# Patient Record
Sex: Female | Born: 1990
Health system: Southern US, Community
[De-identification: ages and names within clinical notes are randomized; demographics above are authoritative.]

## PROBLEM LIST (undated history)

## (undated) DIAGNOSIS — F419 Anxiety disorder, unspecified: Secondary | ICD-10-CM

## (undated) DIAGNOSIS — M549 Dorsalgia, unspecified: Secondary | ICD-10-CM

## (undated) DIAGNOSIS — F32A Depression, unspecified: Secondary | ICD-10-CM

## (undated) DIAGNOSIS — T7840XA Allergy, unspecified, initial encounter: Secondary | ICD-10-CM

## (undated) DIAGNOSIS — K219 Gastro-esophageal reflux disease without esophagitis: Secondary | ICD-10-CM

## (undated) HISTORY — DX: Allergy, unspecified, initial encounter: T78.40XA

## (undated) HISTORY — PX: TOE SURGERY: SHX1073

## (undated) HISTORY — PX: WISDOM TOOTH EXTRACTION: SHX21

## (undated) HISTORY — DX: Depression, unspecified: F32.A

## (undated) HISTORY — DX: Gastro-esophageal reflux disease without esophagitis: K21.9

## (undated) SURGERY — Surgical Case
Anesthesia: *Unknown

---
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
## 2013-06-19 LAB — HM PAP SMEAR: HM PAP: NEGATIVE

## 2014-04-15 DIAGNOSIS — Q270 Congenital absence and hypoplasia of umbilical artery: Secondary | ICD-10-CM | POA: Insufficient documentation

## 2014-04-15 HISTORY — DX: Congenital absence and hypoplasia of umbilical artery: Q27.0

## 2014-05-26 LAB — HM PAP SMEAR: HM PAP: NEGATIVE

## 2014-06-18 DIAGNOSIS — Z3490 Encounter for supervision of normal pregnancy, unspecified, unspecified trimester: Secondary | ICD-10-CM | POA: Insufficient documentation

## 2014-06-18 HISTORY — DX: Encounter for supervision of normal pregnancy, unspecified, unspecified trimester: Z34.90

## 2014-07-30 DIAGNOSIS — O368390 Maternal care for abnormalities of the fetal heart rate or rhythm, unspecified trimester, not applicable or unspecified: Secondary | ICD-10-CM

## 2014-07-30 HISTORY — DX: Maternal care for abnormalities of the fetal heart rate or rhythm, unspecified trimester, not applicable or unspecified: O36.8390

## 2014-09-14 DIAGNOSIS — Z98891 History of uterine scar from previous surgery: Secondary | ICD-10-CM | POA: Insufficient documentation

## 2014-09-14 DIAGNOSIS — Z30430 Encounter for insertion of intrauterine contraceptive device: Secondary | ICD-10-CM | POA: Insufficient documentation

## 2014-11-09 ENCOUNTER — Encounter: Payer: Self-pay | Admitting: *Deleted

## 2015-01-18 ENCOUNTER — Encounter: Payer: Self-pay | Admitting: Family Medicine

## 2015-01-18 ENCOUNTER — Ambulatory Visit (INDEPENDENT_AMBULATORY_CARE_PROVIDER_SITE_OTHER): Payer: Medicaid Other | Admitting: Family Medicine

## 2015-01-18 VITALS — BP 118/78 | Temp 99.3°F

## 2015-01-18 DIAGNOSIS — J029 Acute pharyngitis, unspecified: Secondary | ICD-10-CM

## 2015-01-18 MED ORDER — AZITHROMYCIN 250 MG PO TABS: ORAL_TABLET | ORAL | Status: DC

## 2015-01-18 NOTE — Progress Notes (Signed)
   Subjective:    Patient ID: Rebecca Schroeder, female    DOB: 06-Oct-1990, 24 y.o.   MRN: [id number]  HPI Chief Complaint  Patient presents with  . sore throat    sore throat this morning., low grade fever, hurts to swallow. pain in left ear and when drinking or eating feels like its going to pour out her eat.    She is new to the practice and here with complaints of fatigue since yesterday, low grade fever, sore throat, post nasal drainage and mild cough since this morning upon awakening. Reports when she swallows her left ear hurts. She also reports mild dizziness for a couple of hours that she describes as if her body was on a boat. Eating and drinking less than normal due to throat pain.  Does not smoke. 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  She has not taken any medications today.   PMH: none Surgeries: C-section, wisdom teeth, left toe    reviewed allergies, medications, past medical , surgical and social history.  Review of Systems  pertinent positives and negatives in the history of present illness.    Objective:   Physical Exam BP 118/78 mmHg  Temp(Src) 99.3 F (37.4 C) (Tympanic) Alert and in no distress.  No sinus tenderness. Left tympanic membrane with fluid, no bulging or erythema, right tympanic membrane normal and  Bilateral canals are normal. Pharyngeal area is erythematous with bilateral tonsillar edema 2+, no exudate. Neck is supple with left anterior cervical adenopathy and tenderness. Cardiac exam shows a regular sinus rhythm without murmurs or gallops. Lungs are clear to auscultation.   negative rapid strep    Assessment & Plan:  Acute pharyngitis, unspecified etiology  Discussed that her strep test was negative but this could be because she is early in her illness. Recommend symptomatic treatment with saltwater gargles , warm fluids, Tylenol or ibuprofen. She may also take an over-the-counter decongestant.  Recommend staying well  hydrated and she will let me know if she is not improving in the next 2-3 days or if she is not back to normal after completing the antibiotic.

## 2015-01-18 NOTE — Patient Instructions (Signed)
Treat your symptoms,  Use salt water gargles, or warm fluids for your sore throats, take  Ibuprofen or Tylenol for fever and pain,  Make sure you're drinking plenty of fluids.  If you are not improving in the next 2-3 days let me know. If you are not back to normal after completing the antibiotic let me know.  Pharyngitis Pharyngitis is redness, pain, and swelling (inflammation) of your pharynx.  CAUSES  Pharyngitis is usually caused by infection. Most of the time, these infections are from viruses (viral) and are part of a cold. However, sometimes pharyngitis is caused by bacteria (bacterial). Pharyngitis can also be caused by allergies. Viral pharyngitis may be spread from person to person by coughing, sneezing, and personal items or utensils (cups, forks, spoons, toothbrushes). Bacterial pharyngitis may be spread from person to person by more intimate contact, such as kissing.  SIGNS AND SYMPTOMS  Symptoms of pharyngitis include:   Sore throat.   Tiredness (fatigue).   Low-grade fever.   Headache.  Joint pain and muscle aches.  Skin rashes.  Swollen lymph nodes.  Plaque-like film on throat or tonsils (often seen with bacterial pharyngitis). DIAGNOSIS  Your health care provider will ask you questions about your illness and your symptoms. Your medical history, along with a physical exam, is often all that is needed to diagnose pharyngitis. Sometimes, a rapid strep test is done. Other lab tests may also be done, depending on the suspected cause.  TREATMENT  Viral pharyngitis will usually get better in 3-4 days without the use of medicine. Bacterial pharyngitis is treated with medicines that kill germs (antibiotics).  HOME CARE INSTRUCTIONS   Drink enough water and fluids to keep your urine clear or pale yellow.   Only take over-the-counter or prescription medicines as directed by your health care provider:   If you are prescribed antibiotics, make sure you finish them even if  you start to feel better.   Do not take aspirin.   Get lots of rest.   Gargle with 8 oz of salt water ( tsp of salt per 1 qt of water) as often as every 1-2 hours to soothe your throat.   Throat lozenges (if you are not at risk for choking) or sprays may be used to soothe your throat. SEEK MEDICAL CARE IF:   You have large, tender lumps in your neck.  You have a rash.  You cough up green, yellow-brown, or bloody spit. SEEK IMMEDIATE MEDICAL CARE IF:   Your neck becomes stiff.  You drool or are unable to swallow liquids.  You vomit or are unable to keep medicines or liquids down.  You have severe pain that does not go away with the use of recommended medicines.  You have trouble breathing (not caused by a stuffy nose). MAKE SURE YOU:   Understand these instructions.  Will watch your condition.  Will get help right away if you are not doing well or get worse.   This information is not intended to replace advice given to you by your health care provider. Make sure you discuss any questions you have with your health care provider.   Document Released: 01/22/2005 Document Revised: 11/12/2012 Document Reviewed: 09/29/2012 Elsevier Interactive Patient Education [employer]2016 Elsevier Inc.

## 2015-01-21 LAB — POCT RAPID STREP A (OFFICE): Rapid Strep A Screen: NEGATIVE

## 2015-04-12 DIAGNOSIS — N938 Other specified abnormal uterine and vaginal bleeding: Secondary | ICD-10-CM | POA: Diagnosis not present

## 2015-04-13 ENCOUNTER — Other Ambulatory Visit: Payer: 59

## 2015-04-13 DIAGNOSIS — Z0184 Encounter for antibody response examination: Secondary | ICD-10-CM

## 2015-04-14 LAB — ACUTE HEP PANEL AND HEP B SURFACE AB
HCV Ab: NEGATIVE
Hep A IgM: NONREACTIVE
Hep B C IgM: NONREACTIVE
Hepatitis B Surface Ag: NEGATIVE

## 2015-04-21 ENCOUNTER — Ambulatory Visit (INDEPENDENT_AMBULATORY_CARE_PROVIDER_SITE_OTHER): Payer: 59 | Admitting: Physician Assistant

## 2015-04-21 ENCOUNTER — Encounter: Payer: Self-pay | Admitting: Physician Assistant

## 2015-04-21 VITALS — BP 120/70 | HR 72 | Temp 98.5°F | Resp 18 | Ht 60.0 in | Wt 158.0 lb

## 2015-04-21 DIAGNOSIS — Z0000 Encounter for general adult medical examination without abnormal findings: Secondary | ICD-10-CM | POA: Diagnosis not present

## 2015-04-21 NOTE — Progress Notes (Signed)
Patient ID: Rebecca BrightlyCourtney M Demario MRN: [id number], DOB: 02/18/1990, 25 y.o. Date of Encounter: 04/21/2015,   Chief Complaint: New Pt. Establish Care. Has had Gyn Exam. Has 128 month old son. Physical (CPE)  HPI: 25 y.o. y/o white female  here for above.   She states that she had complicated pregnancy so had to switch gynecologist and see a specialist. 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  Says that she is now seeing a new gynecologist. Has Mirena and is also using NuvaRing for 1 month because of bleeding issues and this was prescribed by GYN.  She states that she works at [employer] which she says is on [place].  She states that she is very frustrated with her weight. Says that she went through a time of emotional stress because of issues with the baby's father. Is that during that time she had no appetite and was eating very little. Says that she is also been exercising. Is that even during that time she still lost very little weight.   No other complaints or concerns.  Says that she has had no primary care provider in many years and just is here to establish as well as have physical.   Review of Systems: Consitutional: No fever, chills, fatigue, night sweats, lymphadenopathy. Eyes: No visual changes, eye redness, or discharge. ENT/Mouth: No ear pain, sore throat, nasal drainage, or sinus pain. Cardiovascular: No chest pressure,heaviness, tightness or squeezing, even with exertion. No increased shortness of breath or dyspnea on exertion.No palpitations, edema, orthopnea, PND. Respiratory: No cough, hemoptysis, SOB, or wheezing. Gastrointestinal: No anorexia, dysphagia, reflux, pain, nausea, vomiting, hematemesis, diarrhea, constipation, BRBPR, or melena. Breast: No mass, nodules, bulging, or retraction. No skin changes or inflammation. No nipple discharge. No lymphadenopathy. Genitourinary: No dysuria, hematuria, incontinence, vaginal discharge, pruritis, burning, abnormal bleeding,  or pain. Musculoskeletal: No decreased ROM, No joint pain or swelling. No significant pain in neck, back, or extremities. Skin: No rash, pruritis, or concerning lesions. Neurological: No headache, dizziness, syncope, seizures, tremors, memory loss, coordination problems, or paresthesias. Psychological: No anxiety, depression, hallucinations, SI/HI. Endocrine: No polydipsia, polyphagia, polyuria, or known diabetes.No increased fatigue. No palpitations/rapid heart rate. No significant/unexplained weight change. All other systems were reviewed and are otherwise negative.  No past medical history on file.   Past Surgical History  Procedure Laterality Date  . Cesarean section    . Wisdom tooth extraction      Home Meds:  Outpatient Prescriptions Prior to Visit  Medication Sig Dispense Refill  . levonorgestrel (MIRENA) 20 MCG/24HR IUD 1 each by Intrauterine route once.    Marland Kitchen. azithromycin (ZITHROMAX Z-PAK) 250 MG tablet Take 2 pills on day 1, then take 1 pill on days 2 through 5. 6 each 0   No facility-administered medications prior to visit.    Allergies:  Allergies  Allergen Reactions  . Codeine Nausea And Vomiting  . Hydrocodone-Acetaminophen Nausea And Vomiting  . Penicillins Other (See Comments)    unknown    Social History   Social History  . Marital Status: Single    Spouse Name: N/A  . Number of Children: N/A  . Years of Education: N/A   Occupational History  . Not on file.   Social History Main Topics  . Smoking status: Never Smoker   . Smokeless tobacco: Not on file  . Alcohol Use: No  . Drug Use: No  . Sexual Activity: Yes    Birth Control/ Protection: IUD   Other Topics Concern  . Not  on file   Social History Narrative    History reviewed. No pertinent family history.  Physical Exam: Blood pressure 120/70, pulse 72, temperature 98.5 F (36.9 C), temperature source Oral, resp. rate 18, height 5' (1.524 m), weight 158 lb (71.668 kg)., Body mass index  is 30.86 kg/(m^2). General: Well developed, well nourished,WF. Appears in no acute distress. HEENT: Normocephalic, atraumatic. Conjunctiva pink, sclera non-icteric. Pupils 2 mm constricting to 1 mm, round, regular, and equally reactive to light and accomodation. EOMI. Internal auditory canal clear. TMs with good cone of light and without pathology. Nasal mucosa pink. Nares are without discharge. No sinus tenderness. Oral mucosa pink.  Pharynx without exudate.   Neck: Supple. Trachea midline. No thyromegaly. Full ROM. No lymphadenopathy.No Carotid Bruits. Lungs: Clear to auscultation bilaterally without wheezes, rales, or rhonchi. Breathing is of normal effort and unlabored. Cardiovascular: RRR with S1 S2. No murmurs, rubs, or gallops. Distal pulses 2+ symmetrically. No carotid or abdominal bruits. Breast: Per Gyn Abdomen: Soft, non-tender, non-distended with normoactive bowel sounds. No hepatosplenomegaly or masses. No rebound/guarding. No CVA tenderness. No hernias.  Genitourinary: Per Gyn.  Musculoskeletal: Full range of motion and 5/5 strength throughout.  Skin: Warm and moist without erythema, ecchymosis, wounds, or rash. Neuro: A+Ox3. CN II-XII grossly intact. Moves all extremities spontaneously. Full sensation throughout. Normal gait. Psych:  Responds to questions appropriately with a normal affect.   Assessment/Plan:  25 y.o. y/o female here for CPE  1. Visit for preventive health examination A. Screening Labs: Secondary to her work schedule, she will not be able to come in fasting. Therefore, will just go ahead and check other labs now. - CBC with Differential/Platelet - COMPLETE METABOLIC PANEL WITH GFR - TSH - VITAMIN D 25 Hydroxy (Vit-D Deficiency, Fractures)  B. Pap: Per Gyn  F. Immunizations:  Influenza:  She received Fall 2016 Tetanus:  She states that she received this at [facility] during her pregnancy.  Murray Hodgkins Knapp, [place], [facility] 04/21/2015 4:09 PM

## 2015-04-22 ENCOUNTER — Ambulatory Visit (INDEPENDENT_AMBULATORY_CARE_PROVIDER_SITE_OTHER): Payer: 59 | Admitting: Medical

## 2015-04-22 VITALS — BP 122/70 | HR 68

## 2015-04-22 DIAGNOSIS — R300 Dysuria: Secondary | ICD-10-CM

## 2015-04-22 DIAGNOSIS — R350 Frequency of micturition: Secondary | ICD-10-CM

## 2015-04-22 LAB — VITAMIN D 25 HYDROXY (VIT D DEFICIENCY, FRACTURES): Vit D, 25-Hydroxy: 30 ng/mL (ref 30–100)

## 2015-04-22 LAB — COMPLETE METABOLIC PANEL WITH GFR
ALT: 55 U/L — ABNORMAL HIGH (ref 6–29)
AST: 21 U/L (ref 10–30)
Albumin: 4.3 g/dL (ref 3.6–5.1)
Alkaline Phosphatase: 72 U/L (ref 33–115)
BUN: 12 mg/dL (ref 7–25)
CO2: 24 mmol/L (ref 20–31)
Calcium: 9.2 mg/dL (ref 8.6–10.2)
Chloride: 105 mmol/L (ref 98–110)
Creat: 0.61 mg/dL (ref 0.50–1.10)
GFR, Est African American: >89 mL/min (ref >=60)
GFR, Est Non African American: >89 mL/min (ref >=60)
Glucose, Bld: 74 mg/dL (ref 70–99)
Potassium: 3.9 mmol/L (ref 3.5–5.3)
Sodium: 137 mmol/L (ref 135–146)
Total Bilirubin: 0.2 mg/dL (ref 0.2–1.2)
Total Protein: 7 g/dL (ref 6.1–8.1)

## 2015-04-22 LAB — POCT URINALYSIS DIPSTICK
Bilirubin, UA: NEGATIVE
Glucose, UA: NEGATIVE
Ketones, UA: NEGATIVE
NITRITE UA: NEGATIVE
UROBILINOGEN UA: NEGATIVE
pH, UA: 6

## 2015-04-22 LAB — CBC WITH DIFFERENTIAL/PLATELET
Basophils Absolute: 0 10*3/uL (ref 0.0–0.1)
Basophils Relative: 0 % (ref 0–1)
Eosinophils Absolute: 0.1 10*3/uL (ref 0.0–0.7)
Eosinophils Relative: 1 % (ref 0–5)
HCT: 37.9 % (ref 36.0–46.0)
Hemoglobin: 12.2 g/dL (ref 12.0–15.0)
Lymphocytes Relative: 27 % (ref 12–46)
Lymphs Abs: 2.3 10*3/uL (ref 0.7–4.0)
MCH: 26.8 pg (ref 26.0–34.0)
MCHC: 32.2 g/dL (ref 30.0–36.0)
MCV: 83.1 fL (ref 78.0–100.0)
MPV: 11.5 fL (ref 8.6–12.4)
Monocytes Absolute: 0.7 10*3/uL (ref 0.1–1.0)
Monocytes Relative: 8 % (ref 3–12)
Neutro Abs: 5.6 10*3/uL (ref 1.7–7.7)
Neutrophils Relative %: 64 % (ref 43–77)
Platelets: 265 10*3/uL (ref 150–400)
RBC: 4.56 MIL/uL (ref 3.87–5.11)
RDW: 15.9 % — ABNORMAL HIGH (ref 11.5–15.5)
WBC: 8.7 10*3/uL (ref 4.0–10.5)

## 2015-04-22 LAB — TSH: TSH: 1 mIU/L

## 2015-04-22 MED ORDER — PHENAZOPYRIDINE HCL 100 MG PO TABS: 100.0000 mg | ORAL_TABLET | Freq: Three times a day (TID) | ORAL | Status: DC | PRN

## 2015-04-22 MED ORDER — CIPROFLOXACIN HCL 500 MG PO TABS: 500.0000 mg | ORAL_TABLET | Freq: Two times a day (BID) | ORAL | Status: DC

## 2015-04-22 NOTE — Progress Notes (Signed)
Subjective: Chief Complaint  Patient presents with  . urinary freq    started this morning. pressure, cramping   Here for urinary concern.  Woke up this morning feeling like she couldn't hold her urine.  Feeling some urinary urgency, cramping, pressure.  No back pain, no fever.  Some nausea but she hasn't eaten this morning or dinner last night.   Sees gyn, on Mirena and on Nuvaring that was just started recently due hormones being out of what.   Has had UTI when pregnant but she didn't really have a lot of symptoms.  No other hx/o UTI.  No vaginal c/o or concern for STD.  Monogamous.     Water intake is ok.  No other aggravating or relieving factors. No other complaint.  No past medical history on file.   ROS as in subjective   Objective; There were no vitals taken for this visit.  Gen: wd, wn, nad Skin: warm, dry Abdomen: +bs, soft, mild suprapubic tenderness, no mass, no organomegaly No back tenderness   Assessment: Encounter Diagnoses  Name Primary?  . Frequency of urination Yes  . Dysuria     Plan: symptoms suggest possible UTI.  Urine culture sent.   meds sent to pharmacy. Increased water intake, drink some cranberry juice the next few days.  F/u pending urine culture  Rebecca Schroeder was seen today for urinary freq.  Diagnoses and all orders for this visit:  Frequency of urination -     POCT urinalysis dipstick -     Urine culture  Dysuria -     Urine culture  Other orders -     phenazopyridine (PYRIDIUM) 100 MG tablet; Take 1 tablet (100 mg total) by mouth 3 (three) times daily as needed for pain. -     ciprofloxacin (CIPRO) 500 MG tablet; Take 1 tablet (500 mg total) by mouth 2 (two) times daily.

## 2015-04-23 LAB — URINE CULTURE

## 2015-04-25 ENCOUNTER — Other Ambulatory Visit: Payer: Self-pay

## 2015-04-25 DIAGNOSIS — Z23 Encounter for immunization: Secondary | ICD-10-CM

## 2015-05-02 ENCOUNTER — Encounter: Payer: Self-pay | Admitting: Family Medicine

## 2015-05-10 ENCOUNTER — Ambulatory Visit (INDEPENDENT_AMBULATORY_CARE_PROVIDER_SITE_OTHER): Payer: 59 | Admitting: Family Medicine

## 2015-05-10 ENCOUNTER — Encounter: Payer: Self-pay | Admitting: Family Medicine

## 2015-05-10 VITALS — BP 118/60 | HR 72 | Temp 98.7°F | Resp 18 | Ht 62.0 in | Wt 165.0 lb

## 2015-05-10 DIAGNOSIS — F4323 Adjustment disorder with mixed anxiety and depressed mood: Secondary | ICD-10-CM | POA: Diagnosis not present

## 2015-05-10 DIAGNOSIS — R229 Localized swelling, mass and lump, unspecified: Secondary | ICD-10-CM

## 2015-05-10 DIAGNOSIS — R945 Abnormal results of liver function studies: Secondary | ICD-10-CM

## 2015-05-10 DIAGNOSIS — R7989 Other specified abnormal findings of blood chemistry: Secondary | ICD-10-CM | POA: Diagnosis not present

## 2015-05-10 DIAGNOSIS — R799 Abnormal finding of blood chemistry, unspecified: Secondary | ICD-10-CM | POA: Diagnosis not present

## 2015-05-10 MED ORDER — BUPROPION HCL ER (XL) 150 MG PO TB24: 150.0000 mg | ORAL_TABLET | Freq: Every day | ORAL | Status: DC

## 2015-05-10 NOTE — Progress Notes (Signed)
Patient ID: Rebecca Schroeder, female   DOB: [birth year], 25 y.o.   MRN: [id number]    Subjective:    Patient ID: Rebecca Brightlyourtney M Schroeder, female    DOB: [birth year], 25 y.o.   MRN: [id number]  Patient presents for New Patient Patient here for follow-up. She has establish care couple weeks ago. She is here to discuss some issues that she did not feel comfortable discussing at the last visit.  She has been suffering with depression and anxiety symptoms for the past few months. She has a 1267-month-old son. She was in a fairly good relationship with her boyfriend until he became started having anger outbursts. She thought that he was having issues with depression and anxiety himself however she found out about 2 months ago that he is been addicted to drugs. He did a stent in rehabilitation and is now released. She does not have any support from her family she states her father is an alcoholic and her mother seems more self absorbed. She is raising her 7367-month-old son for the most part by herself especially financially. 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 Her boyfriend's son trying to get himself back on track and she feels like she needs to help him that he is a source of a lot of distress and anxiety. His family is also causing issues by plain sides. She's not been able to each she has lost her appetite she is not sleeping very well she finds herself tearful at times and other times extremely anxious. She is seeing a therapist with her job and they mentioned that she may need medication. She is very fearful of taking medications. She states that her mother is on Celexa and some other medications.  She states that she is concerned about her weight despite her not eating very much she is still not losing weight and she tries to exercise to help with the stress. Her son does give her great joy.  She's also concerned about the small knots that popped up after she had her son. They're small cyst that  popped up they have not changed in size there nontender but she can feel them. A couple or in her stretch marks others are on her lower back  She had recent labs done which were reviewed at the bedside she had isolated mild elevation in her ALT of note she did have some type of illness a few weeks before this question pneumonia or tract infection but her culture was negative.    Review Of Systems:  GEN- denies fatigue, fever, weight loss,weakness, recent illness HEENT- denies eye drainage, change in vision, nasal discharge, CVS- denies chest pain, palpitations RESP- denies SOB, cough, wheeze ABD- denies N/V, change in stools, abd pain GU- denies dysuria, hematuria, dribbling, incontinence MSK- denies joint pain, muscle aches, injury Neuro- denies headache, dizziness, syncope, seizure activity       Objective:    BP 118/60 mmHg  Pulse 72  Temp(Src) 98.7 F (37.1 C) (Oral)  Resp 18  Ht 5\' 2"  (1.575 m)  Wt 165 lb (74.844 kg)  BMI 30.17 kg/m2 GEN- NAD, alert and oriented x3 Skin- small pea size subcutaneous nodules on left lower back, even smaller right flank, non palpated in stretch marks  Psych- normal affect and mood, no SI/no hallucinations    Approx 30 minutes spent with pt > 50% on counseling      Assessment & Plan:      Problem List Items Addressed  This Visit    None    Visit Diagnoses    Elevated LFTs    -  Primary    Relevant Orders    Comprehensive metabolic panel       Note: This dictation was prepared with Dragon dictation along with smaller phrase technology. Any transcriptional errors that result from this process are unintentional.

## 2015-05-10 NOTE — Patient Instructions (Signed)
Try the wellbutrin once a day  Release of records - Jeannetta EllisKathy Clayton - on [address]  We will send lab results  F/U 4 weeks

## 2015-05-11 ENCOUNTER — Encounter: Payer: Self-pay | Admitting: Family Medicine

## 2015-05-11 DIAGNOSIS — F418 Other specified anxiety disorders: Secondary | ICD-10-CM | POA: Insufficient documentation

## 2015-05-11 DIAGNOSIS — R229 Localized swelling, mass and lump, unspecified: Secondary | ICD-10-CM | POA: Insufficient documentation

## 2015-05-11 LAB — COMPREHENSIVE METABOLIC PANEL
ALBUMIN: 4.5 g/dL (ref 3.6–5.1)
ALK PHOS: 82 U/L (ref 33–115)
ALT: 17 U/L (ref 6–29)
AST: 14 U/L (ref 10–30)
BUN: 14 mg/dL (ref 7–25)
CALCIUM: 9.6 mg/dL (ref 8.6–10.2)
CHLORIDE: 103 mmol/L (ref 98–110)
CO2: 22 mmol/L (ref 20–31)
Creat: 0.68 mg/dL (ref 0.50–1.10)
Glucose, Bld: 69 mg/dL — ABNORMAL LOW (ref 70–99)
POTASSIUM: 4 mmol/L (ref 3.5–5.3)
Sodium: 138 mmol/L (ref 135–146)
Total Bilirubin: 0.2 mg/dL (ref 0.2–1.2)
Total Protein: 7.1 g/dL (ref 6.1–8.1)

## 2015-05-11 NOTE — Assessment & Plan Note (Signed)
Benign subcutaneous nodules they're very small. I recommended that which is monitor for now. Taking further offer of having one cut out to be sent to pathology she wants to hold off

## 2015-05-11 NOTE — Assessment & Plan Note (Signed)
She is very wary of medications. She will continue with her psychologist. Try to avoid anything that could be addictive as well. We'll try her on Wellbutrin this is not known to cause as much daytime somnolence and no weight gain. Follow-up in 4 weeks and we'll see how she's doing. To help with compliance with medication we'll start her on extended release 150 mg once a day

## 2015-05-17 ENCOUNTER — Encounter: Payer: Self-pay | Admitting: Family Medicine

## 2015-05-26 MED ORDER — VENLAFAXINE HCL 37.5 MG PO TABS: 37.5000 mg | ORAL_TABLET | Freq: Every day | ORAL | Status: DC

## 2015-05-26 NOTE — Addendum Note (Signed)
Addended by: Phillips OdorSIX, Lincoln Ginley H on: 05/26/2015 08:01 AM   Modules accepted: Orders

## 2015-05-31 ENCOUNTER — Encounter: Payer: Self-pay | Admitting: Family Medicine

## 2015-06-07 ENCOUNTER — Ambulatory Visit: Payer: 59 | Admitting: Family Medicine

## 2015-06-21 ENCOUNTER — Encounter: Payer: Self-pay | Admitting: Family Medicine

## 2015-06-27 ENCOUNTER — Encounter: Payer: Self-pay | Admitting: Medical

## 2015-06-27 ENCOUNTER — Ambulatory Visit (INDEPENDENT_AMBULATORY_CARE_PROVIDER_SITE_OTHER): Payer: 59 | Admitting: Medical

## 2015-06-27 VITALS — BP 110/74 | HR 94 | Temp 97.9°F

## 2015-06-27 DIAGNOSIS — H9202 Otalgia, left ear: Secondary | ICD-10-CM | POA: Diagnosis not present

## 2015-06-27 DIAGNOSIS — J029 Acute pharyngitis, unspecified: Secondary | ICD-10-CM

## 2015-06-27 LAB — POCT RAPID STREP A (OFFICE): Rapid Strep A Screen: NEGATIVE

## 2015-06-27 NOTE — Addendum Note (Signed)
Addended by: Herminio CommonsJOHNSON, SABRINA A on: 06/27/2015 11:08 AM   Modules accepted: Orders

## 2015-06-27 NOTE — Progress Notes (Signed)
Subjective: Chief Complaint  Patient presents with  . sick    ear pain, dizziness, throat pain- left sided, possible draiange   Here for illness x 1+ day of symptoms.   Has bad pain in left throat going back towards ear.    Feels feverish although she hasn't had fever.  Feels post nasal drainage.   Feels dizzy.   Hearing seems ok   When walking feels like she has to grab something to keep her balance.  Went to concert Friday 3 days ago, did a lot of singing, and at first thought the throat was from yelling and singing all night.  Has a little cough.  Has itch in throat.  No NVD.  No sinus pressure.   Has had congestion for over a week thought, attributed to allergies.  Has sick contacts here at work.      No past medical history on file. ROS as in subjective  Objective: BP 110/74 mmHg  Pulse 94  Temp(Src) 97.9 F (36.6 C) (Tympanic)  SpO2 98%  General appearance: no distress, WD/WN HEENT: normocephalic, conjunctiva/corneas normal, sclerae anicteric, nares patent, no discharge or erythema, pharynx tonsils and uvula with mild erythema,no exudate.  Oral cavity: MMM, no lesions  Neck: supple, tender shoddy anterior nodes on left, no thyromegaly Lungs: CTA bilaterally, no wheezes, rhonchi, or rales  Laboratory Strep test done. Results:negative.   Assessment: Encounter Diagnoses  Name Primary?  . Sore throat Yes  . Otalgia, left     Plan: symptoms and exam suggest viral pharyngitis.   Discussed supportive care, rest, hydration, and call/return if worse or not improving.

## 2015-06-28 ENCOUNTER — Ambulatory Visit (INDEPENDENT_AMBULATORY_CARE_PROVIDER_SITE_OTHER): Payer: 59 | Admitting: Family Medicine

## 2015-06-28 ENCOUNTER — Encounter: Payer: Self-pay | Admitting: Family Medicine

## 2015-06-28 VITALS — BP 122/76 | HR 92 | Temp 98.0°F | Resp 18 | Wt 151.0 lb

## 2015-06-28 DIAGNOSIS — F4323 Adjustment disorder with mixed anxiety and depressed mood: Secondary | ICD-10-CM

## 2015-06-28 NOTE — Assessment & Plan Note (Signed)
For now we will stay off medications for a couple weeks and to her current illness resides so this is not confuse any side effects that she may have to we did discuss starting Lexapro at bedtime as another alternative. She would like to continue with her therapy and counseling she has many other family issues and problems from the past that she would like to discuss. Finances are of concern because of the co-pays. She is going to see if she will qualify to extend her services.

## 2015-06-28 NOTE — Progress Notes (Signed)
Patient ID: Rebecca Schroeder, female   DOB: 08/17/1990, 25 y.o.   MRN: [id number]   Subjective:    Patient ID: Rebecca Schroeder, female    DOB: 05-26-1990, 25 y.o.   MRN: [id number]  Patient presents for follow up on medication Lesion here to follow-up situational anxiety and depression. I started her on Wellbutrin she had very interesting side effects poor appetite could not sleep therefore we discontinued this and then I had her try venlafaxine that she was very concerned about anything that may cause weight gain. She emailed back stating after starting the medicine in the afternoon she was started to feel little dizzy/hedaches between 2 and 3pm which was a little confusing. She stopped the Effexor and her symptoms resolve however couple days later she began with her viral symptoms of sore throat earache.  She is still seeing a therapist she has a lot of history with regards to her child's father and his substance abuse in her wanting to have a family with him.She does state that he moved back in 2 weeks ago after he left his parent's house. She states that his demeanor is much improved and he is communicating better. He has not had any anger outbursts he will often step outside sleep with people from his rehabilitation to help calm him down if he does get overwhelmed. Unfortunately he is not in therapy because of financial problems. She states that he is working and helping her financially. She would like to continue therapy but her current situation is through her job and they told her that that they will be wrapping up her sessions. She states that there are many other issues that she is having with her family in the past that she would like to continue getting therapy for as it does help.  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 She did see a provider there yesterday because some sore throat she is diagnosed with viral pharyngitis strep was negative  Review Of Systems:  GEN- denies fatigue, fever,  weight loss,weakness, recent illness HEENT- denies eye drainage, change in vision, +nasal discharge, CVS- denies chest pain, palpitations RESP- denies SOB, cough, wheeze ABD- denies N/V, change in stools, abd pain GU- denies dysuria, hematuria, dribbling, incontinence MSK- denies joint pain, muscle aches, injury Neuro- denies headache, dizziness, syncope, seizure activity       Objective:    BP 122/76 mmHg  Pulse 92  Temp(Src) 98 F (36.7 C) (Oral)  Resp 18  Wt 151 lb (68.493 kg) GEN- NAD, alert and oriented x3 HEENT- PERRL, EOMI, non injected sclera, pink conjunctiva, MMM, oropharynx clear, TM clear bilat enlarged tonsils, no exudates  Neck- Supple, no LAD  CVS- RRR, no murmur RESP-CTAB Psych- normal affect and mood  Pulses- Radial 2+        Assessment & Plan:    Viral illness, supportive care for now, if symptoms not improved will call in zpak Friday   Problem List Items Addressed This Visit    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 She would like to continue with her therapy and counseling she has many other family issues and problems from the past that she would like to discuss. Finances are of concern because of the  co-pays. She is going to see if she will qualify to extend her services.         Note: This dictation was prepared with Dragon dictation along with smaller phrase technology. Any transcriptional errors that result from this process are unintentional.

## 2015-06-28 NOTE — Patient Instructions (Signed)
I will contact you in a few weeks about lexapro Email if throat does not improve  F/U pending medications

## 2015-06-30 ENCOUNTER — Encounter: Payer: Self-pay | Admitting: Family Medicine

## 2015-06-30 MED ORDER — AZITHROMYCIN 250 MG PO TABS: ORAL_TABLET | ORAL | Status: DC

## 2015-07-07 ENCOUNTER — Other Ambulatory Visit: Payer: 59

## 2015-07-07 DIAGNOSIS — Z0184 Encounter for antibody response examination: Secondary | ICD-10-CM | POA: Diagnosis not present

## 2015-07-08 ENCOUNTER — Other Ambulatory Visit: Payer: Self-pay | Admitting: Medical

## 2015-07-08 ENCOUNTER — Other Ambulatory Visit: Payer: 59

## 2015-07-08 DIAGNOSIS — Z0184 Encounter for antibody response examination: Secondary | ICD-10-CM

## 2015-07-08 LAB — HEPATITIS B SURFACE ANTIBODY,QUALITATIVE: Hep B S Ab: POSITIVE — AB

## 2015-07-11 ENCOUNTER — Other Ambulatory Visit: Payer: Self-pay | Admitting: Medical

## 2015-07-11 DIAGNOSIS — Z139 Encounter for screening, unspecified: Secondary | ICD-10-CM

## 2015-07-11 LAB — HEPATITIS B DNA, ULTRAQUANTITATIVE, PCR
Hepatitis B DNA (Calc): <1.3 Log IU/mL (ref <1.30)
Hepatitis B DNA: <20 IU/mL (ref <20)

## 2015-07-12 ENCOUNTER — Other Ambulatory Visit: Payer: Self-pay | Admitting: Medical

## 2015-07-12 ENCOUNTER — Encounter: Payer: Self-pay | Admitting: Family Medicine

## 2015-07-12 DIAGNOSIS — Z139 Encounter for screening, unspecified: Secondary | ICD-10-CM

## 2015-07-12 MED ORDER — ESCITALOPRAM OXALATE 5 MG PO TABS: 5.0000 mg | ORAL_TABLET | Freq: Every day | ORAL | Status: DC

## 2015-07-18 LAB — HEPATITIS B SURFACE ANTIBODY, QUANTITATIVE

## 2015-07-29 DIAGNOSIS — H5213 Myopia, bilateral: Secondary | ICD-10-CM | POA: Diagnosis not present

## 2015-08-05 ENCOUNTER — Encounter: Payer: Self-pay | Admitting: Family Medicine

## 2015-08-05 MED ORDER — ESCITALOPRAM OXALATE 10 MG PO TABS: 10.0000 mg | ORAL_TABLET | Freq: Every day | ORAL | Status: DC

## 2015-11-18 ENCOUNTER — Encounter: Payer: Self-pay | Admitting: Family Medicine

## 2016-02-20 ENCOUNTER — Encounter: Payer: Self-pay | Admitting: Family Medicine

## 2016-02-20 ENCOUNTER — Ambulatory Visit (INDEPENDENT_AMBULATORY_CARE_PROVIDER_SITE_OTHER): Payer: 59 | Admitting: Family Medicine

## 2016-02-20 VITALS — BP 118/70 | HR 80 | Temp 98.9°F | Resp 14 | Ht 62.0 in | Wt 167.0 lb

## 2016-02-20 DIAGNOSIS — N390 Urinary tract infection, site not specified: Secondary | ICD-10-CM | POA: Diagnosis not present

## 2016-02-20 DIAGNOSIS — B349 Viral infection, unspecified: Secondary | ICD-10-CM

## 2016-02-20 LAB — URINALYSIS, ROUTINE W REFLEX MICROSCOPIC
BILIRUBIN URINE: NEGATIVE
GLUCOSE, UA: NEGATIVE
Hgb urine dipstick: NEGATIVE
Leukocytes, UA: NEGATIVE
Nitrite: NEGATIVE
PH: 6 (ref 5.0–8.0)
SPECIFIC GRAVITY, URINE: 1.025 (ref 1.001–1.035)

## 2016-02-20 LAB — URINALYSIS, MICROSCOPIC ONLY
CASTS: NONE SEEN [LPF]
CRYSTALS: NONE SEEN [HPF]
YEAST: NONE SEEN [HPF]

## 2016-02-20 MED ORDER — ONDANSETRON 4 MG PO TBDP: 4.0000 mg | ORAL_TABLET | Freq: Three times a day (TID) | ORAL | 0 refills | Status: DC | PRN

## 2016-02-20 MED ORDER — NITROFURANTOIN MONOHYD MACRO 100 MG PO CAPS: 100.0000 mg | ORAL_CAPSULE | Freq: Two times a day (BID) | ORAL | 0 refills | Status: DC

## 2016-02-20 NOTE — Progress Notes (Signed)
   Subjective:    Patient ID: Rebecca Schroeder, female    DOB: 1990/12/16, 26 y.o.   MRN: [id number]  Patient presents for Illness (x1 week- head cold, abd pain, N/V, lower back pain- thinks it may be d/t UTI)   Last Tuesday, had sore throat, congestion, took OTC sinus/cold medicine, started with body aches, took flu test this was negative. Stomach pain and sorness in lower adominal region after urinating and still aching in legs. Sat/Sunday pain worsened, and vomiting started Sunday. Decreased appetite. Feeling dizzy No solids food since Sat, drinking gineralge  No BM since Friday, history of constipation does not feel this is uncommon   Has nexplanon , took pregnancy it was negative   Work as pediatrician    Review Of Systems:  GEN- denies fatigue, fever, weight loss,weakness, recent illness HEENT- denies eye drainage, change in vision, nasal discharge, CVS- denies chest pain, palpitations RESP- denies SOB, cough, wheeze ABD- denies N/V, change in stools, abd pain GU- denies dysuria, hematuria, dribbling, incontinence MSK- denies joint pain, muscle aches, injury Neuro- denies headache, dizziness, syncope, seizure activity       Objective:    BP 118/70 (BP Location: Left Arm, Patient Position: Sitting, Cuff Size: Large)   Pulse 80   Temp 98.9 F (37.2 C) (Oral)   Resp 14   Ht 5\' 2"  (1.575 m)   Wt 167 lb (75.8 kg)   SpO2 99%   BMI 30.54 kg/m  GEN- NAD, alert and oriented x3 HEENT- PERRL, EOMI, non injected sclera, pink conjunctiva, MMM, oropharynx clear, nares clear rhinorrhea  Neck- Supple, no LAD  CVS- RRR, no murmur RESP-CTAB ABD-NABS,soft,NT,ND, no CVA tenderness  EXT- No edema Pulses- Radial 2+        Assessment & Plan:      Problem List Items Addressed This Visit    None    Visit Diagnoses    Urinary tract infection without hematuria, site unspecified    -  Primary   Start macrobid   Relevant Medications   nitrofurantoin, macrocrystal-monohydrate,  (MACROBID) 100 MG capsule   Other Relevant Orders   Urinalysis, Routine w reflex microscopic (Completed)   Urine culture   Viral illness       Push fluids, OTC cough medicine, supportive care, rest      Note: This dictation was prepared with Dragon dictation along with smaller phrase technology. Any transcriptional errors that result from this process are unintentional.

## 2016-02-20 NOTE — Patient Instructions (Signed)
Push fluids Give note for work - out 1/15-1/16/18   Rest  We will call with lab results  F/U as needed

## 2016-02-21 ENCOUNTER — Encounter: Payer: Self-pay | Admitting: Family Medicine

## 2016-02-23 LAB — URINE CULTURE

## 2016-02-24 ENCOUNTER — Other Ambulatory Visit: Payer: Self-pay | Admitting: *Deleted

## 2016-02-24 DIAGNOSIS — N390 Urinary tract infection, site not specified: Secondary | ICD-10-CM

## 2016-02-24 MED ORDER — FLUCONAZOLE 150 MG PO TABS: 150.0000 mg | ORAL_TABLET | Freq: Once | ORAL | 1 refills | Status: AC

## 2016-02-24 MED ORDER — SULFAMETHOXAZOLE-TRIMETHOPRIM 800-160 MG PO TABS: 1.0000 | ORAL_TABLET | Freq: Two times a day (BID) | ORAL | 0 refills | Status: DC

## 2016-03-20 ENCOUNTER — Encounter: Payer: Self-pay | Admitting: Family Medicine

## 2016-03-22 ENCOUNTER — Ambulatory Visit (INDEPENDENT_AMBULATORY_CARE_PROVIDER_SITE_OTHER): Payer: 59 | Admitting: Family Medicine

## 2016-03-22 ENCOUNTER — Encounter: Payer: Self-pay | Admitting: Family Medicine

## 2016-03-22 VITALS — BP 110/74 | HR 96 | Temp 98.4°F | Resp 12 | Ht 62.0 in | Wt 170.0 lb

## 2016-03-22 DIAGNOSIS — R300 Dysuria: Secondary | ICD-10-CM | POA: Diagnosis not present

## 2016-03-22 DIAGNOSIS — R102 Pelvic and perineal pain: Secondary | ICD-10-CM

## 2016-03-22 LAB — URINALYSIS, ROUTINE W REFLEX MICROSCOPIC
BILIRUBIN URINE: NEGATIVE
Glucose, UA: NEGATIVE
Hgb urine dipstick: NEGATIVE
Ketones, ur: NEGATIVE
Nitrite: NEGATIVE
PH: 6 (ref 5.0–8.0)
Protein, ur: NEGATIVE
SPECIFIC GRAVITY, URINE: 1.02 (ref 1.001–1.035)

## 2016-03-22 LAB — URINALYSIS, MICROSCOPIC ONLY
CASTS: NONE SEEN [LPF]
CRYSTALS: NONE SEEN [HPF]
Yeast: NONE SEEN [HPF]

## 2016-03-22 NOTE — Progress Notes (Signed)
   Subjective:    Patient ID: Rebecca Schroeder, female    DOB: Jan 10, 1991, 26 y.o.   MRN: [id number]  Patient presents for Dysuria (x months- states that she has increased sharp pain in bladder- has used Azo- had N/V qith Azo)   Pt here with recurrent dysuria, she had mild staph infection on culture from Jan, I switched her from macrobid to bactrim and symptoms did clear up mostly. Now has recurrent symptoms has pressure with urination. AZO made her sick this week so she stopped. She is trying to drink her fluids. Denies any vaginal discharge. Has Mirena which has always caused some discomfort, especially into her upper thighs and pelvic area on and off over past year. She has one sexual partner, no abnormal bleeding.  She has appt with GYN in 2 weeks to have Mirena removed    Review Of Systems:  GEN- denies fatigue, fever, weight loss,weakness, recent illness HEENT- denies eye drainage, change in vision, nasal discharge, CVS- denies chest pain, palpitations RESP- denies SOB, cough, wheeze ABD- denies N/V, change in stools,+ abd pain GU- denies dysuria, hematuria, dribbling, incontinence MSK- denies joint pain, muscle aches, injury Neuro- denies headache, dizziness, syncope, seizure activity       Objective:    BP 110/74   Pulse 96   Temp 98.4 F (36.9 C) (Oral)   Resp 12   Ht 5\' 2"  (1.575 m)   Wt 170 lb (77.1 kg)   SpO2 98%   BMI 31.09 kg/m  GEN- NAD, alert and oriented x3 HEENT- PERRL, EOMI, non injected sclera, pink conjunctiva, MMM, oropharynx clear CVS- RRR, no murmur RESP-CTAB ABD-NABS,soft, ttp suprapubic region, no CVA tendernss GU- she declined me doing exam, states she had not showered, she did self swabs          Assessment & Plan:      Problem List Items Addressed This Visit    None    Visit Diagnoses    Dysuria    -  Primary   Recheck Urine culture. Has had 2 antibiotics, she did self swabs so could not evaluate for PID though possibility. GC did  return neg, wet prep pending.   Relevant Orders   Urinalysis, Routine w reflex microscopic (Completed)   WET PREP FOR TRICH, YEAST, CLUE   GC/Chlamydia Probe Amp (Completed)   Urine culture   Suprapubic pain          Note: This dictation was prepared with Dragon dictation along with smaller phrase technology. Any transcriptional errors that result from this process are unintentional.

## 2016-03-22 NOTE — Patient Instructions (Signed)
We will call with results

## 2016-03-23 ENCOUNTER — Encounter: Payer: Self-pay | Admitting: Family Medicine

## 2016-03-23 LAB — GC/CHLAMYDIA PROBE AMP
CT PROBE, AMP APTIMA: NOT DETECTED
GC Probe RNA: NOT DETECTED

## 2016-03-23 LAB — WET PREP FOR TRICH, YEAST, CLUE

## 2016-03-23 LAB — WET PREP, GENITAL
Clue Cells Wet Prep HPF POC: NONE SEEN
Trich, Wet Prep: NONE SEEN
WBC, Wet Prep HPF POC: NONE SEEN
Yeast Wet Prep HPF POC: NONE SEEN

## 2016-03-24 LAB — URINE CULTURE: ORGANISM ID, BACTERIA: NO GROWTH

## 2016-04-18 LAB — HM PAP SMEAR

## 2016-05-09 ENCOUNTER — Encounter: Payer: Self-pay | Admitting: Family Medicine

## 2016-05-25 ENCOUNTER — Emergency Department (HOSPITAL_COMMUNITY): Payer: No Typology Code available for payment source

## 2016-05-25 ENCOUNTER — Emergency Department (HOSPITAL_COMMUNITY)
Admission: EM | Admit: 2016-05-25 | Discharge: 2016-05-25 | Disposition: A | Discharge status: Home / Self Care | Payer: No Typology Code available for payment source | Attending: Emergency Medicine | Admitting: Emergency Medicine

## 2016-05-25 ENCOUNTER — Encounter (HOSPITAL_COMMUNITY): Payer: Self-pay | Admitting: *Deleted

## 2016-05-25 DIAGNOSIS — Y92410 Unspecified street and highway as the place of occurrence of the external cause: Secondary | ICD-10-CM | POA: Insufficient documentation

## 2016-05-25 DIAGNOSIS — Y999 Unspecified external cause status: Secondary | ICD-10-CM | POA: Diagnosis not present

## 2016-05-25 DIAGNOSIS — Y939 Activity, unspecified: Secondary | ICD-10-CM | POA: Insufficient documentation

## 2016-05-25 DIAGNOSIS — S20219A Contusion of unspecified front wall of thorax, initial encounter: Secondary | ICD-10-CM

## 2016-05-25 DIAGNOSIS — S299XXA Unspecified injury of thorax, initial encounter: Secondary | ICD-10-CM | POA: Diagnosis present

## 2016-05-25 LAB — POC URINE PREG, ED: Preg Test, Ur: NEGATIVE

## 2016-05-25 NOTE — ED Notes (Signed)
Patient transported to X-ray 

## 2016-05-25 NOTE — ED Notes (Signed)
Pt ambulatory with quick steady gait. Denies LOC. NAD.  ED provider at bedside for assessment.
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
## 2016-06-05 ENCOUNTER — Encounter: Payer: Self-pay | Admitting: Family Medicine

## 2016-07-11 ENCOUNTER — Telehealth: Payer: Self-pay | Admitting: Internal Medicine

## 2016-07-11 ENCOUNTER — Encounter: Payer: Self-pay | Admitting: Internal Medicine

## 2016-07-11 MED ORDER — AZITHROMYCIN 250 MG PO TABS: ORAL_TABLET | ORAL | 0 refills | Status: DC

## 2016-07-11 NOTE — Telephone Encounter (Signed)
Afebrile but having hoarse voice, has had ear discomfort and last week had scratchy throat with negative rapid strep screen. Son with similar symptoms  Left TM dull not red. Pharynx slightly injected.  Imp: Left OM and URI  Plan: Zithromax Z-Pak to take 2 po day 1 followed by one po days 2-5

## 2016-07-25 ENCOUNTER — Ambulatory Visit (INDEPENDENT_AMBULATORY_CARE_PROVIDER_SITE_OTHER): Payer: 59 | Admitting: Family Medicine

## 2016-07-25 ENCOUNTER — Encounter: Payer: Self-pay | Admitting: Family Medicine

## 2016-07-25 VITALS — BP 102/72 | HR 84 | Temp 98.1°F | Resp 14 | Ht 62.0 in | Wt 183.0 lb

## 2016-07-25 DIAGNOSIS — R5383 Other fatigue: Secondary | ICD-10-CM

## 2016-07-25 DIAGNOSIS — S335XXA Sprain of ligaments of lumbar spine, initial encounter: Secondary | ICD-10-CM | POA: Diagnosis not present

## 2016-07-25 DIAGNOSIS — F418 Other specified anxiety disorders: Secondary | ICD-10-CM | POA: Diagnosis not present

## 2016-07-25 DIAGNOSIS — F5104 Psychophysiologic insomnia: Secondary | ICD-10-CM

## 2016-07-25 MED ORDER — ESCITALOPRAM OXALATE 5 MG PO TABS: 5.0000 mg | ORAL_TABLET | Freq: Every day | ORAL | 2 refills | Status: DC

## 2016-07-25 MED ORDER — CYCLOBENZAPRINE HCL 5 MG PO TABS: 5.0000 mg | ORAL_TABLET | Freq: Every day | ORAL | 0 refills | Status: DC

## 2016-07-25 NOTE — Assessment & Plan Note (Signed)
Restart lexapro 5mg  at bedtime, insomnia associated Discussed exercise and healthy diet and how this affects not only her weight but mental stress as well.

## 2016-07-25 NOTE — Patient Instructions (Signed)
FU 8 weeks

## 2016-07-25 NOTE — Progress Notes (Signed)
Subjective:    Patient ID: Rebecca Schroeder, female    DOB: October 30, 1990, 26 y.o.   MRN: [id number]  Patient presents for Insomnia (continues to have difficulty sleeping) and Lower Back Pain (x3 weeks- states that she hurt back while bending don in caar- resolved after 3 days- now has been cleaning alot and is having similar pain)  Patient here with back pain couple weeks ago she was trying to do something with her sons car seat she was bent over she had severe pain with radiation down both legs and went away after a few days. Until couple days ago she was sweeping and vacuuming doing a lot of cleaning to get ready for a birthday party and she felt pain in her lower back again. Denies any radiating symptoms of tingling or numbness in her feet no change in bowel or bladder. He can anything for the pain.  Also stated discuss her sleep and mood. She is has history of anxiety and depression was on treatment in the past. She is moved jobs again she said 3 different jobs in the past year. She states that she has gained significant weight she will go times or she eats a lot of junk food and snacks and then she feels bad about herself or gaining weight so she will skip meals. She typically has a banana or oatmeal for breakfast or skips that she will often he leftovers or legible or frozen meals for lunch and then states that her dinner is random and they do eat out. She does not exercise on a regular basis. She does not sleep very well and still has problems, calming down her anxiety.   Review Of Systems:  GEN- + fatigue, fever, weight loss,weakness, recent illness HEENT- denies eye drainage, change in vision, nasal discharge, CVS- denies chest pain, palpitations RESP- denies SOB, cough, wheeze ABD- denies N/V, change in stools, abd pain GU- denies dysuria, hematuria, dribbling, incontinence MSK- + joint pain, muscle aches, injury Neuro- denies headache, dizziness, syncope, seizure activity        Objective:    BP 102/72   Pulse 84   Temp 98.1 F (36.7 C) (Oral)   Resp 14   Ht 5\' 2"  (1.575 m)   Wt 183 lb (83 kg)   SpO2 98%   BMI 33.47 kg/m  GEN- NAD, alert and oriented x3 HEENT- PERRL, EOMI, non injected sclera, pink conjunctiva, MMM, oropharynx clear Neck- Supple, no thyromegaly CVS- RRR, no murmur RESP-CTAB MSK- TTP bilat paraspinals, +spasm, neg SLR, fair ROM spine, Good ROM HIPS/KNEES, non antalgic gait, rotation normal Neuro- motor and strength equal bilat LE, tone normal DTR SYMMETRIC, sensation in tact Psych- normal affect andmood, no SI        Assessment & Plan:      Problem List Items Addressed This Visit    Depression with anxiety    Restart lexapro 5mg  at bedtime, insomnia associated Discussed exercise and healthy diet and how this affects not only her weight but mental stress as well.       Chronic insomnia    Other Visit Diagnoses    Lumbar sprain, initial encounter    -  Primary   MSK pain, mild spasm, no red flags, prn flexeril can take OTC ibuprofen   Other fatigue       check TSH, cmet, cbc with weight gain F/U 8 weeks   Relevant Orders   CBC with Differential/Platelet   Comprehensive metabolic panel   TSH  Note: This dictation was prepared with Dragon dictation along with smaller phrase technology. Any transcriptional errors that result from this process are unintentional.

## 2016-07-26 LAB — CBC WITH DIFFERENTIAL/PLATELET
BASOS PCT: 0 %
Basophils Absolute: 0 cells/uL (ref 0–200)
EOS ABS: 97 {cells}/uL (ref 15–500)
Eosinophils Relative: 1 %
HEMATOCRIT: 41.3 % (ref 35.0–45.0)
Hemoglobin: 13.6 g/dL (ref 12.0–15.0)
LYMPHS ABS: 2425 {cells}/uL (ref 850–3900)
Lymphocytes Relative: 25 %
MCH: 29 pg (ref 27.0–33.0)
MCHC: 32.9 g/dL (ref 32.0–36.0)
MCV: 88.1 fL (ref 80.0–100.0)
MONO ABS: 776 {cells}/uL (ref 200–950)
MPV: 10.2 fL (ref 7.5–12.5)
Monocytes Relative: 8 %
Neutro Abs: 6402 cells/uL (ref 1500–7800)
Neutrophils Relative %: 66 %
Platelets: 257 10*3/uL (ref 140–400)
RBC: 4.69 MIL/uL (ref 3.80–5.10)
RDW: 13.5 % (ref 11.0–15.0)
WBC: 9.7 10*3/uL (ref 3.8–10.8)

## 2016-07-26 LAB — COMPREHENSIVE METABOLIC PANEL
ALT: 16 U/L (ref 6–29)
AST: 11 U/L (ref 10–30)
Albumin: 4.4 g/dL (ref 3.6–5.1)
Alkaline Phosphatase: 101 U/L (ref 33–115)
BUN: 9 mg/dL (ref 7–25)
CHLORIDE: 103 mmol/L (ref 98–110)
CO2: 26 mmol/L (ref 20–31)
CREATININE: 0.67 mg/dL (ref 0.50–1.10)
Calcium: 9.3 mg/dL (ref 8.6–10.2)
Glucose, Bld: 80 mg/dL (ref 70–99)
Potassium: 3.7 mmol/L (ref 3.5–5.3)
Sodium: 138 mmol/L (ref 135–146)
Total Bilirubin: 0.3 mg/dL (ref 0.2–1.2)
Total Protein: 7.1 g/dL (ref 6.1–8.1)

## 2016-07-26 LAB — TSH: TSH: 0.82 mIU/L

## 2016-08-01 ENCOUNTER — Ambulatory Visit (INDEPENDENT_AMBULATORY_CARE_PROVIDER_SITE_OTHER): Payer: 59 | Admitting: Internal Medicine

## 2016-08-01 ENCOUNTER — Encounter: Payer: Self-pay | Admitting: Internal Medicine

## 2016-08-01 VITALS — Temp 98.7°F

## 2016-08-01 DIAGNOSIS — H6501 Acute serous otitis media, right ear: Secondary | ICD-10-CM

## 2016-08-01 DIAGNOSIS — H60501 Unspecified acute noninfective otitis externa, right ear: Secondary | ICD-10-CM

## 2016-08-01 MED ORDER — AZITHROMYCIN 250 MG PO TABS: ORAL_TABLET | ORAL | 0 refills | Status: DC

## 2016-08-01 MED ORDER — NEOMYCIN-POLYMYXIN-HC 3.5-10000-1 OT SOLN: OTIC | 0 refills | Status: DC

## 2016-08-01 NOTE — Patient Instructions (Signed)
Zithromax Z-PAK take 2 tablets day 1 followed by 1 tablet days 2 through 5. Cortisporin Otic suspension 4 drops in right external ear canal 4 times daily for 5-7 days. Tylenol as needed for pain or fever.

## 2016-08-01 NOTE — Progress Notes (Signed)
   Subjective:    Patient ID: Rebecca Schroeder, female    DOB: 1990/10/09, 26 y.o.   MRN: [id number]  HPI Pt. C/o right ear pain. Recently given Z-pak on 07/11/16 for ear discomfort and sore throat. Dx then with LOM and URI.  Afebrile. C/o of  wetness in right ear canal. No recent swimming.    Review of Systems     Objective:   Physical Exam  Right external canal is red proximally. Minimal cerumen in ear canal. No evidence of perforation of right TM. Right TM is full and pink but not red.      Assessment & Plan:  Right otitis media and externa  Plan: Zithromax Z-PAK take 2 tablets day 1 followed by 1 tablet days 2 through 5. Cortisporin otic suspension 4 drops right ear canal 4 times daily for 5-7 days.

## 2016-08-03 DIAGNOSIS — H60501 Unspecified acute noninfective otitis externa, right ear: Secondary | ICD-10-CM | POA: Insufficient documentation

## 2016-08-03 DIAGNOSIS — H60391 Other infective otitis externa, right ear: Secondary | ICD-10-CM | POA: Diagnosis not present

## 2016-08-10 DIAGNOSIS — H6990 Unspecified Eustachian tube disorder, unspecified ear: Secondary | ICD-10-CM | POA: Insufficient documentation

## 2016-08-10 DIAGNOSIS — H6980 Other specified disorders of Eustachian tube, unspecified ear: Secondary | ICD-10-CM | POA: Diagnosis not present

## 2016-08-22 DIAGNOSIS — N911 Secondary amenorrhea: Secondary | ICD-10-CM | POA: Diagnosis not present

## 2016-08-24 DIAGNOSIS — N912 Amenorrhea, unspecified: Secondary | ICD-10-CM | POA: Diagnosis not present

## 2016-08-29 DIAGNOSIS — N911 Secondary amenorrhea: Secondary | ICD-10-CM | POA: Diagnosis not present

## 2016-08-29 MED FILL — DICLEGIS DR 10-10 MG TABLET: 10-10 | 60 days supply | Qty: 120 | Fill #0

## 2016-09-03 ENCOUNTER — Ambulatory Visit (INDEPENDENT_AMBULATORY_CARE_PROVIDER_SITE_OTHER): Payer: 59 | Admitting: Family Medicine

## 2016-09-03 ENCOUNTER — Encounter: Payer: Self-pay | Admitting: Family Medicine

## 2016-09-03 VITALS — BP 104/70 | HR 88 | Temp 98.5°F | Resp 14 | Ht 62.0 in | Wt 183.0 lb

## 2016-09-03 DIAGNOSIS — J069 Acute upper respiratory infection, unspecified: Secondary | ICD-10-CM

## 2016-09-03 MED ORDER — AZITHROMYCIN 250 MG PO TABS: ORAL_TABLET | ORAL | 0 refills | Status: DC

## 2016-09-03 NOTE — Patient Instructions (Signed)
Use nasal saline Benadryl  Use flonase short term Use robitusisn  Wait do not take antibiotic until Wed/Thursday if not improving  F/U as needed

## 2016-09-03 NOTE — Progress Notes (Signed)
   Subjective:    Patient ID: Kieth BrightlyCourtney M Oetken, female    DOB: Mar 28, 1990, [age] y.o.   MRN: [id number]  Patient presents for Illness (x2 days- SOB, low grade fever, muscle aches, head congestion, nasal congestion- IS [redacted] WKS PREGNANT)  Pt here with URI symptoms, Back ground info- she works in [employer] office, was treated for OM and URI back in June with initially zpak, then repeated with cortisporin drops at end of June. Sent to ENT Dr. Lazarus SalinesWolicki-  Cultures were negative, she was advised to stop drops and OM was already treated. Told to use flonase and F/U in AUgust   She is currently [redacted] weeks pregnant, past 2 days she has had SOB, head and nasal congestion. SUnday started with fever, never higher than 101F, took zyrtec for nasal congestion.Took Tylenol yesterday. Feels like she cant breathe when she takes a deep breath. Never used flonase. No known sick contacts     Review Of Systems:  GEN- denies fatigue, fever, weight loss,weakness, recent illness HEENT- denies eye drainage, change in vision, nasal discharge, CVS- denies chest pain, palpitations RESP- denies SOB, cough, wheeze ABD- denies N/V, change in stools, abd pain GU- denies dysuria, hematuria, dribbling, incontinence MSK- denies joint pain, muscle aches, injury Neuro- denies headache, dizziness, syncope, seizure activity       Objective:    BP 104/70   Pulse 88   Temp 98.5 F (36.9 C) (Oral)   Resp 14   Ht 5\' 2"  (1.575 m)   Wt 183 lb (83 kg)   LMP 04/24/2016   SpO2 98%   BMI 33.47 kg/m  GEN- NAD, alert and oriented x3 HEENT- PERRL, EOMI, non injected sclera, pink conjunctiva, MMM, oropharynx clear  TM clear bilat no effusion,  No  maxillary sinus tenderness, +inflammed turbinates, + Nasal drainage  Neck- Supple, no LAD CVS- RRR, no murmur RESP-CTAB EXT- No edema Pulses- Radial 2+         Assessment & Plan:      Problem List Items Addressed This Visit    None    Visit Diagnoses    Viral URI    -  Primary   Viral  symptoms, use benadryl, nasal saline, flonase, robitussin as she is pregnant. Keep hydrated. The nasal congestion is the worst part causing sensation of not being able to breathe. Her chest exam is clear. I did give her prescription for a CPAP that she is advised not to try to fill this until Wednesday or Thursday for symptoms do not improve for her fever returns. She is also alert her OB/GYN if she does have to take the antibiotic.    Relevant Medications   azithromycin (ZITHROMAX) 250 MG tablet      Note: This dictation was prepared with Dragon dictation along with smaller phrase technology. Any transcriptional errors that result from this process are unintentional.

## 2016-09-05 MED FILL — AZITHROMYCIN 250 MG TABLET: 250 | 5 days supply | Qty: 6 | Fill #0

## 2016-09-12 DIAGNOSIS — Z3481 Encounter for supervision of other normal pregnancy, first trimester: Secondary | ICD-10-CM | POA: Diagnosis not present

## 2016-09-12 DIAGNOSIS — Z3482 Encounter for supervision of other normal pregnancy, second trimester: Secondary | ICD-10-CM | POA: Diagnosis not present

## 2016-09-12 DIAGNOSIS — Z3685 Encounter for antenatal screening for Streptococcus B: Secondary | ICD-10-CM | POA: Diagnosis not present

## 2016-09-12 LAB — OB RESULTS CONSOLE ANTIBODY SCREEN: Antibody Screen: NEGATIVE

## 2016-09-12 LAB — OB RESULTS CONSOLE GC/CHLAMYDIA
CHLAMYDIA, DNA PROBE: NEGATIVE
Gonorrhea: NEGATIVE

## 2016-09-12 LAB — OB RESULTS CONSOLE HEPATITIS B SURFACE ANTIGEN: Hepatitis B Surface Ag: NEGATIVE

## 2016-09-12 LAB — OB RESULTS CONSOLE RUBELLA ANTIBODY, IGM: Rubella: IMMUNE

## 2016-09-12 LAB — OB RESULTS CONSOLE ABO/RH: RH TYPE: POSITIVE

## 2016-09-12 LAB — OB RESULTS CONSOLE HIV ANTIBODY (ROUTINE TESTING): HIV: NONREACTIVE

## 2016-09-12 LAB — OB RESULTS CONSOLE RPR: RPR: NONREACTIVE

## 2016-09-18 MED FILL — NITROFURANTOIN MONO-MCR 100: 100 | 5 days supply | Qty: 10 | Fill #0

## 2016-09-19 ENCOUNTER — Ambulatory Visit: Payer: 59 | Admitting: Family Medicine

## 2016-10-03 DIAGNOSIS — Z3491 Encounter for supervision of normal pregnancy, unspecified, first trimester: Secondary | ICD-10-CM | POA: Diagnosis not present

## 2016-10-03 DIAGNOSIS — Z36 Encounter for antenatal screening for chromosomal anomalies: Secondary | ICD-10-CM | POA: Diagnosis not present

## 2016-10-03 DIAGNOSIS — Z3682 Encounter for antenatal screening for nuchal translucency: Secondary | ICD-10-CM | POA: Diagnosis not present

## 2016-10-03 DIAGNOSIS — Z3A12 12 weeks gestation of pregnancy: Secondary | ICD-10-CM | POA: Diagnosis not present

## 2016-10-17 ENCOUNTER — Encounter: Payer: 59 | Admitting: Family Medicine

## 2016-10-17 DIAGNOSIS — Z3492 Encounter for supervision of normal pregnancy, unspecified, second trimester: Secondary | ICD-10-CM | POA: Diagnosis not present

## 2016-10-17 DIAGNOSIS — Z113 Encounter for screening for infections with a predominantly sexual mode of transmission: Secondary | ICD-10-CM | POA: Diagnosis not present

## 2016-10-30 MED FILL — DICLEGIS DR 10-10 MG TABLET: 10-10 | 60 days supply | Qty: 120 | Fill #0

## 2016-10-31 MED FILL — METOCLOPRAMIDE 10 MG TABLET: 10 | 30 days supply | Qty: 30 | Fill #0

## 2016-11-01 MED FILL — BUTALB-ACETAMIN-CAFF 50-325: 50-325-40 | 3 days supply | Qty: 15 | Fill #0

## 2016-11-21 DIAGNOSIS — Z363 Encounter for antenatal screening for malformations: Secondary | ICD-10-CM | POA: Diagnosis not present

## 2016-11-21 DIAGNOSIS — Z3480 Encounter for supervision of other normal pregnancy, unspecified trimester: Secondary | ICD-10-CM | POA: Diagnosis not present

## 2016-11-21 DIAGNOSIS — Z3A19 19 weeks gestation of pregnancy: Secondary | ICD-10-CM | POA: Diagnosis not present

## 2016-11-21 DIAGNOSIS — Z3400 Encounter for supervision of normal first pregnancy, unspecified trimester: Secondary | ICD-10-CM | POA: Diagnosis not present

## 2016-12-06 ENCOUNTER — Encounter: Payer: Self-pay | Admitting: Family Medicine

## 2016-12-19 DIAGNOSIS — Z3400 Encounter for supervision of normal first pregnancy, unspecified trimester: Secondary | ICD-10-CM | POA: Diagnosis not present

## 2016-12-19 DIAGNOSIS — Z362 Encounter for other antenatal screening follow-up: Secondary | ICD-10-CM | POA: Diagnosis not present

## 2016-12-19 DIAGNOSIS — Z3A23 23 weeks gestation of pregnancy: Secondary | ICD-10-CM | POA: Diagnosis not present

## 2017-01-04 MED FILL — DICLEGIS DR 10-10 MG TABLET: 10-10 | 60 days supply | Qty: 120 | Fill #0

## 2017-01-16 DIAGNOSIS — O365120 Maternal care for known or suspected placental insufficiency, second trimester, not applicable or unspecified: Secondary | ICD-10-CM | POA: Diagnosis not present

## 2017-01-16 DIAGNOSIS — Z3400 Encounter for supervision of normal first pregnancy, unspecified trimester: Secondary | ICD-10-CM | POA: Diagnosis not present

## 2017-01-16 DIAGNOSIS — Z23 Encounter for immunization: Secondary | ICD-10-CM | POA: Diagnosis not present

## 2017-01-16 DIAGNOSIS — Z3A27 27 weeks gestation of pregnancy: Secondary | ICD-10-CM | POA: Diagnosis not present

## 2017-01-18 ENCOUNTER — Encounter: Payer: Self-pay | Admitting: Family Medicine

## 2017-03-04 MED FILL — DICLEGIS DR 10-10 MG TABLET: 10-10 | 60 days supply | Qty: 120 | Fill #0

## 2017-03-26 ENCOUNTER — Encounter (HOSPITAL_COMMUNITY): Payer: Self-pay | Admitting: *Deleted

## 2017-04-04 MED FILL — AZITHROMYCIN 500 MG TABLET: 500 | 3 days supply | Qty: 3 | Fill #0

## 2017-04-05 ENCOUNTER — Encounter: Payer: Self-pay | Admitting: Family Medicine

## 2017-04-05 ENCOUNTER — Ambulatory Visit (INDEPENDENT_AMBULATORY_CARE_PROVIDER_SITE_OTHER): Payer: No Typology Code available for payment source | Admitting: Family Medicine

## 2017-04-05 VITALS — BP 112/70 | HR 112 | Temp 98.1°F | Resp 18 | Ht 62.0 in | Wt 199.0 lb

## 2017-04-05 DIAGNOSIS — B9689 Other specified bacterial agents as the cause of diseases classified elsewhere: Secondary | ICD-10-CM | POA: Diagnosis not present

## 2017-04-05 DIAGNOSIS — J019 Acute sinusitis, unspecified: Secondary | ICD-10-CM | POA: Diagnosis not present

## 2017-04-05 MED ORDER — CEFDINIR 300 MG PO CAPS: 300.0000 mg | ORAL_CAPSULE | Freq: Two times a day (BID) | ORAL | 0 refills | Status: DC

## 2017-04-05 MED FILL — CEFDINIR 300 MG CAPSULE: 300 | 10 days supply | Qty: 20 | Fill #0

## 2017-04-05 NOTE — Progress Notes (Signed)
Subjective:    Patient ID: Rebecca Schroeder, female    DOB: February 16, 1990, 27 y.o.   MRN: [id number]  HPI   Patient is [redacted] weeks pregnant.  She has had a cough for 2 weeks that is nonproductive.  Starting Monday of this week, she developed pain in both maxillary and both frontal sinuses.  She also developed rhinorrhea and severe nasal congestion.  She now reports a constant headache in her frontal sinus area.  She called the online doctor and was given a Z-Pak but is uncertain of the clinical diagnosis.  This was yesterday.  She states that she feels worse today and her OB recommended that she be evaluated by her PCP. Past Medical History:  Diagnosis Date  . 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 Back pain    occasional that radiates down leg   Past Surgical History:  Procedure Laterality Date  . CESAREAN SECTION    . TOE SURGERY    . WISDOM TOOTH EXTRACTION     Current Outpatient Medications on File Prior to Visit  Medication Sig Dispense Refill  . azithromycin (ZITHROMAX) 500 MG tablet     . DICLEGIS 10-10 MG TBEC Take 2 tablets by mouth at bedtime.  0  . Prenatal Vit-Fe Fumarate-FA (MULTIVITAMIN-PRENATAL) 27-0.8 MG TABS tablet Take 1 tablet by mouth daily.     . ranitidine (ZANTAC) 300 MG tablet Take 300 mg by mouth at bedtime.     No current facility-administered medications on file prior to visit.    Allergies  Allergen Reactions  . Codeine Nausea And Vomiting  . Hydrocodone-Acetaminophen Nausea And Vomiting  . Penicillins Other (See Comments)    Unknown Has patient had a PCN reaction causing immediate rash, facial/tongue/throat swelling, SOB or lightheadedness with hypotension: Unknown Has patient had a PCN reaction causing severe rash involving mucus membranes or skin necrosis: Unknown Has patient had a PCN reaction that required hospitalization: Unknown Has patient had a PCN reaction occurring within the last 10 years: Unknown If all of the above answers are "NO", then may proceed  with Cephalosporin use. Childhood reaction.  Marland Kitchen Roxicodone [Oxycodone Hcl] Nausea And Vomiting and Other (See Comments)    SEVERE VOMITING----Patient DOES NOT tolerate with anti nausea medications   Social History   Socioeconomic History  . Marital status: Single    Spouse name: Not on file  . Number of children: Not on file  . Years of education: Not on file  . Highest education level: Not on file  Social Needs  . Financial resource strain: Not on file  . Food insecurity - worry: Not on file  . Food insecurity - inability: Not on file  . Transportation needs - medical: Not on file  . Transportation needs - non-medical: Not on file  Occupational History  . Not on file  Tobacco Use  . Smoking status: Never Smoker  . Smokeless tobacco: Never Used  Substance and Sexual Activity  . Alcohol use: Yes    Alcohol/week: 0.0 oz    Comment: occasionally  . Drug use: No  . Sexual activity: Yes    Birth control/protection: IUD  Other Topics Concern  . Not on file  Social History Narrative  . Not on file    Review of Systems  All other systems reviewed and are negative.      Objective:   Physical Exam  Constitutional: She appears well-developed and well-nourished. No distress.  HENT:  Right Ear: Tympanic membrane, external ear  and ear canal normal.  Left Ear: Tympanic membrane, external ear and ear canal normal.  Nose: Mucosal edema and rhinorrhea present. Right sinus exhibits maxillary sinus tenderness and frontal sinus tenderness. Left sinus exhibits maxillary sinus tenderness and frontal sinus tenderness.  Mouth/Throat: Oropharynx is clear and moist. No oropharyngeal exudate.  Eyes: Conjunctivae are normal.  Neck: Neck supple.  Cardiovascular: Normal rate, regular rhythm and normal heart sounds.  Pulmonary/Chest: Effort normal and breath sounds normal. No respiratory distress. She has no wheezes. She has no rales.  Lymphadenopathy:    She has no cervical adenopathy.  Skin:  She is not diaphoretic.  Vitals reviewed.         Assessment & Plan:  Acute bacterial rhinosinusitis  I believe the patient had a viral upper respiratory infection for the last 2 weeks.  I believe it has progressed into a sinus infection based on her clinical exam.  I believe that a cephalosporin would offer better coverage for sinus infection and then a Z-Pak.  She states that she is previously taken Rocephin without any difficulties or allergic reaction.  Therefore I believe she can tolerate a cephalosporin.  She does have a listed allergy to penicillin but she states this was when she was a child and she does not recollect what the allergy was.  She is willing to try the cephalosporin despite the potential allergy if she is truly allergic to penicillin.  Patient will monitor for any signs of allergic reaction

## 2017-04-08 NOTE — Patient Instructions (Signed)
Kieth BrightlyCourtney M Gammon  04/08/2017   Your procedure is scheduled on:  04/10/2017  Enter through the Main Entrance of [facility] at 0530 AM.  Pick up the phone at the desk and dial [phone]  Call this number if you have problems the morning of surgery:[phone]  Remember:   Do not eat food:(After Midnight) Desps de medianoche.  Do not drink clear liquids: (After Midnight) Desps de medianoche.  Take these medicines the morning of surgery with A SIP OF WATER: 0530   Do not wear jewelry, make-up or nail polish.  Do not wear lotions, powders, or perfumes. Do not wear deodorant.  Do not shave 48 hours prior to surgery.  Do not bring valuables to the hospital.  [facility] is not   responsible for any belongings or valuables brought to the hospital.  Contacts, dentures or bridgework may not be worn into surgery.  Leave suitcase in the car. After surgery it may be brought to your room.  For patients admitted to the hospital, checkout time is 11:00 AM the day of              discharge.    N/A   Please read over the following fact sheets that you were given:   Surgical Site Infection Prevention

## 2017-04-09 ENCOUNTER — Encounter (HOSPITAL_COMMUNITY)
Admission: RE | Admit: 2017-04-09 | Discharge: 2017-04-09 | Disposition: A | Discharge status: Home / Self Care | Payer: No Typology Code available for payment source | Source: Ambulatory Visit | Attending: Obstetrics and Gynecology | Admitting: Obstetrics and Gynecology

## 2017-04-09 HISTORY — DX: Dorsalgia, unspecified: M54.9

## 2017-04-09 HISTORY — DX: Anxiety disorder, unspecified: F41.9

## 2017-04-09 LAB — TYPE AND SCREEN
ABO/RH(D): A POS
Antibody Screen: NEGATIVE

## 2017-04-09 LAB — CBC
HEMATOCRIT: 33.9 % — AB (ref 36.0–46.0)
HEMOGLOBIN: 11 g/dL — AB (ref 12.0–15.0)
MCH: 25.6 pg — AB (ref 26.0–34.0)
MCHC: 32.4 g/dL (ref 30.0–36.0)
MCV: 78.8 fL (ref 78.0–100.0)
Platelets: 197 10*3/uL (ref 150–400)
RBC: 4.3 MIL/uL (ref 3.87–5.11)
RDW: 15.5 % (ref 11.5–15.5)
WBC: 9.9 10*3/uL (ref 4.0–10.5)

## 2017-04-09 LAB — ABO/RH: ABO/RH(D): A POS

## 2017-04-09 NOTE — H&P (Signed)
Rebecca BrightlyCourtney M Clinkenbeard is a 27 y.o. female presenting for repeat cesarean section. OB History    Gravida Para Term Preterm AB Living   2 1   1   1    SAB TAB Ectopic Multiple Live Births           1     Past Medical History:  Diagnosis Date  . 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 Back pain    occasional that radiates down leg   Past Surgical History:  Procedure Laterality Date  . CESAREAN SECTION    . TOE SURGERY    . WISDOM TOOTH EXTRACTION     Family History: family history includes Alcohol abuse in her father; Depression in her mother; Diabetes in her maternal grandfather; Hypertension in her maternal grandfather; Prostate cancer in her maternal grandfather; Thyroid disease in her mother. Social History:  reports that  has never smoked. she has never used smokeless tobacco. She reports that she drinks alcohol. She reports that she does not use drugs.     Maternal Diabetes: No Genetic Screening: Normal Maternal Ultrasounds/Referrals: Normal Fetal Ultrasounds or other Referrals:  None Maternal Substance Abuse:  No Significant Maternal Medications:  None Significant Maternal Lab Results:  None Other Comments:  None  ROS History   Last menstrual period 07/03/2016. Maternal Exam:  Abdomen: Fetal presentation: vertex     Physical Exam  Cardiovascular: Normal rate and regular rhythm.  Respiratory: Effort normal and breath sounds normal.  GI: Soft.  Neurological: She has normal reflexes.    Prenatal labs: ABO, Rh: --/--/A POS, A POS Performed at [facility], [address] [place], [place] [address]  [phone](03/05 1020) Antibody: NEG (03/05 1020) Rubella: Immune (08/08 0000) RPR: Nonreactive (08/08 0000)  HBsAg: Negative (08/08 0000)  HIV: Non-reactive (08/08 0000)  GBS:     Assessment/Plan: 27 yo at term for repeat cesarean section   Roselle LocusJames E Shatona Andujar II 04/09/2017, 6:31 PM

## 2017-04-10 ENCOUNTER — Inpatient Hospital Stay (HOSPITAL_COMMUNITY)
Admission: RE | Admit: 2017-04-10 | Discharge: 2017-04-12 | DRG: 787 | Disposition: A | Discharge status: Home / Self Care | Payer: No Typology Code available for payment source | Source: Ambulatory Visit | Attending: Obstetrics and Gynecology | Admitting: Obstetrics and Gynecology

## 2017-04-10 ENCOUNTER — Other Ambulatory Visit: Payer: Self-pay

## 2017-04-10 ENCOUNTER — Inpatient Hospital Stay (HOSPITAL_COMMUNITY): Payer: No Typology Code available for payment source | Admitting: Anesthesiology

## 2017-04-10 ENCOUNTER — Encounter (HOSPITAL_COMMUNITY): Payer: Self-pay | Admitting: *Deleted

## 2017-04-10 ENCOUNTER — Encounter (HOSPITAL_COMMUNITY)
Admission: RE | Disposition: A | Discharge status: Home / Self Care | Payer: Self-pay | Source: Ambulatory Visit | Attending: Obstetrics and Gynecology

## 2017-04-10 DIAGNOSIS — O99824 Streptococcus B carrier state complicating childbirth: Secondary | ICD-10-CM | POA: Diagnosis present

## 2017-04-10 DIAGNOSIS — Z3A39 39 weeks gestation of pregnancy: Secondary | ICD-10-CM

## 2017-04-10 DIAGNOSIS — K219 Gastro-esophageal reflux disease without esophagitis: Secondary | ICD-10-CM | POA: Diagnosis present

## 2017-04-10 DIAGNOSIS — O34211 Maternal care for low transverse scar from previous cesarean delivery: Secondary | ICD-10-CM | POA: Diagnosis present

## 2017-04-10 DIAGNOSIS — O9081 Anemia of the puerperium: Secondary | ICD-10-CM | POA: Diagnosis not present

## 2017-04-10 DIAGNOSIS — D62 Acute posthemorrhagic anemia: Secondary | ICD-10-CM | POA: Diagnosis not present

## 2017-04-10 DIAGNOSIS — O9962 Diseases of the digestive system complicating childbirth: Secondary | ICD-10-CM | POA: Diagnosis present

## 2017-04-10 DIAGNOSIS — Z98891 History of uterine scar from previous surgery: Secondary | ICD-10-CM

## 2017-04-10 LAB — RPR: RPR Ser Ql: NONREACTIVE

## 2017-04-10 LAB — COMPREHENSIVE METABOLIC PANEL
ALBUMIN: 2.3 g/dL — AB (ref 3.5–5.0)
ALT: 18 U/L (ref 14–54)
ANION GAP: 7 (ref 5–15)
AST: 19 U/L (ref 15–41)
Alkaline Phosphatase: 158 U/L — ABNORMAL HIGH (ref 38–126)
BUN: 6 mg/dL (ref 6–20)
CHLORIDE: 107 mmol/L (ref 101–111)
CO2: 20 mmol/L — ABNORMAL LOW (ref 22–32)
Calcium: 7.9 mg/dL — ABNORMAL LOW (ref 8.9–10.3)
Creatinine, Ser: 0.46 mg/dL (ref 0.44–1.00)
GFR calc Af Amer: >60 mL/min (ref >60)
GFR calc non Af Amer: >60 mL/min (ref >60)
GLUCOSE: 63 mg/dL — AB (ref 65–99)
Potassium: 3.8 mmol/L (ref 3.5–5.1)
Sodium: 134 mmol/L — ABNORMAL LOW (ref 135–145)
TOTAL PROTEIN: 5.6 g/dL — AB (ref 6.5–8.1)
Total Bilirubin: 0.5 mg/dL (ref 0.3–1.2)

## 2017-04-10 LAB — CBC
HCT: 30.2 % — ABNORMAL LOW (ref 36.0–46.0)
Hemoglobin: 9.8 g/dL — ABNORMAL LOW (ref 12.0–15.0)
MCH: 25.5 pg — ABNORMAL LOW (ref 26.0–34.0)
MCHC: 32.5 g/dL (ref 30.0–36.0)
MCV: 78.4 fL (ref 78.0–100.0)
PLATELETS: 164 10*3/uL (ref 150–400)
RBC: 3.85 MIL/uL — ABNORMAL LOW (ref 3.87–5.11)
RDW: 15.4 % (ref 11.5–15.5)
WBC: 13.4 10*3/uL — AB (ref 4.0–10.5)

## 2017-04-10 LAB — GLUCOSE, CAPILLARY
GLUCOSE-CAPILLARY: 65 mg/dL (ref 65–99)
GLUCOSE-CAPILLARY: 71 mg/dL (ref 65–99)
Glucose-Capillary: 55 mg/dL — ABNORMAL LOW (ref 65–99)

## 2017-04-10 SURGERY — Surgical Case
Anesthesia: Spinal | Site: Abdomen | Wound class: Clean Contaminated

## 2017-04-10 MED ORDER — BUPIVACAINE IN DEXTROSE 0.75-8.25 % IT SOLN
INTRATHECAL | Status: DC | PRN
  Administered 2017-04-10: 10.5 mg via INTRATHECAL

## 2017-04-10 MED ORDER — TETANUS-DIPHTH-ACELL PERTUSSIS 5-2.5-18.5 LF-MCG/0.5 IM SUSP: 0.5000 mL | Freq: Once | INTRAMUSCULAR | Status: DC

## 2017-04-10 MED ORDER — KETOROLAC TROMETHAMINE 30 MG/ML IJ SOLN: 30.0000 mg | Freq: Once | INTRAMUSCULAR | Status: DC | PRN

## 2017-04-10 MED ORDER — PHENYLEPHRINE 8 MG IN D5W 100 ML (0.08MG/ML) PREMIX OPTIME
INJECTION | INTRAVENOUS | Status: AC
  Filled 2017-04-10: qty 100

## 2017-04-10 MED ORDER — KETOROLAC TROMETHAMINE 30 MG/ML IJ SOLN
INTRAMUSCULAR | Status: AC
  Filled 2017-04-10: qty 1

## 2017-04-10 MED ORDER — ZOLPIDEM TARTRATE 5 MG PO TABS: 5.0000 mg | ORAL_TABLET | Freq: Every evening | ORAL | Status: DC | PRN

## 2017-04-10 MED ORDER — ONDANSETRON HCL 4 MG/2ML IJ SOLN
4.0000 mg | Freq: Once | INTRAMUSCULAR | Status: AC
  Administered 2017-04-10: 4 mg via INTRAVENOUS
  Filled 2017-04-10: qty 2

## 2017-04-10 MED ORDER — OXYTOCIN 10 UNIT/ML IJ SOLN
INTRAMUSCULAR | Status: DC | PRN
  Administered 2017-04-10: 40 [IU] via INTRAVENOUS

## 2017-04-10 MED ORDER — EPHEDRINE 5 MG/ML INJ
10.0000 mg | Freq: Once | INTRAVENOUS | Status: DC
  Filled 2017-04-10: qty 2

## 2017-04-10 MED ORDER — PHENYLEPHRINE 8 MG IN D5W 100 ML (0.08MG/ML) PREMIX OPTIME
INJECTION | INTRAVENOUS | Status: DC | PRN
  Administered 2017-04-10: 60 ug/min via INTRAVENOUS

## 2017-04-10 MED ORDER — METOCLOPRAMIDE HCL 5 MG/ML IJ SOLN
INTRAMUSCULAR | Status: AC
  Filled 2017-04-10: qty 2

## 2017-04-10 MED ORDER — SENNOSIDES-DOCUSATE SODIUM 8.6-50 MG PO TABS
2.0000 | ORAL_TABLET | ORAL | Status: DC
  Administered 2017-04-10 – 2017-04-11 (×2): 2 via ORAL
  Filled 2017-04-10 (×2): qty 2

## 2017-04-10 MED ORDER — ACETAMINOPHEN 325 MG PO TABS
650.0000 mg | ORAL_TABLET | ORAL | Status: DC | PRN
  Administered 2017-04-11 (×2): 650 mg via ORAL
  Filled 2017-04-10 (×3): qty 2

## 2017-04-10 MED ORDER — CYCLOBENZAPRINE HCL 5 MG PO TABS
5.0000 mg | ORAL_TABLET | Freq: Three times a day (TID) | ORAL | Status: DC | PRN
  Filled 2017-04-10: qty 1

## 2017-04-10 MED ORDER — OXYTOCIN 10 UNIT/ML IJ SOLN
INTRAMUSCULAR | Status: AC
  Filled 2017-04-10: qty 4

## 2017-04-10 MED ORDER — LACTATED RINGERS IV SOLN
INTRAVENOUS | Status: DC
  Administered 2017-04-10 (×3): via INTRAVENOUS

## 2017-04-10 MED ORDER — SODIUM CHLORIDE 0.9% FLUSH: 3.0000 mL | INTRAVENOUS | Status: DC | PRN

## 2017-04-10 MED ORDER — DIPHENHYDRAMINE HCL 50 MG/ML IJ SOLN: 12.5000 mg | INTRAMUSCULAR | Status: DC | PRN

## 2017-04-10 MED ORDER — ONDANSETRON HCL 4 MG/2ML IJ SOLN
INTRAMUSCULAR | Status: AC
  Filled 2017-04-10: qty 2

## 2017-04-10 MED ORDER — KETOROLAC TROMETHAMINE 30 MG/ML IJ SOLN: 30.0000 mg | Freq: Four times a day (QID) | INTRAMUSCULAR | Status: AC | PRN

## 2017-04-10 MED ORDER — ACETAMINOPHEN 500 MG PO TABS
1000.0000 mg | ORAL_TABLET | Freq: Four times a day (QID) | ORAL | Status: AC
  Administered 2017-04-10 – 2017-04-11 (×3): 1000 mg via ORAL
  Filled 2017-04-10 (×3): qty 2

## 2017-04-10 MED ORDER — SIMETHICONE 80 MG PO CHEW
80.0000 mg | CHEWABLE_TABLET | Freq: Three times a day (TID) | ORAL | Status: DC
  Administered 2017-04-10 – 2017-04-12 (×4): 80 mg via ORAL
  Filled 2017-04-10 (×6): qty 1

## 2017-04-10 MED ORDER — METOCLOPRAMIDE HCL 5 MG/ML IJ SOLN
10.0000 mg | Freq: Once | INTRAMUSCULAR | Status: AC
  Administered 2017-04-10: 10 mg via INTRAVENOUS

## 2017-04-10 MED ORDER — NALBUPHINE HCL 10 MG/ML IJ SOLN
INTRAMUSCULAR | Status: AC
  Administered 2017-04-10: 10 mg via SUBCUTANEOUS
  Filled 2017-04-10: qty 1

## 2017-04-10 MED ORDER — LACTATED RINGERS IV SOLN: INTRAVENOUS | Status: DC

## 2017-04-10 MED ORDER — MEPERIDINE HCL 25 MG/ML IJ SOLN: 6.2500 mg | INTRAMUSCULAR | Status: DC | PRN

## 2017-04-10 MED ORDER — CEFDINIR 300 MG PO CAPS
300.0000 mg | ORAL_CAPSULE | Freq: Two times a day (BID) | ORAL | Status: DC
  Administered 2017-04-11 – 2017-04-12 (×3): 300 mg via ORAL
  Filled 2017-04-10: qty 1

## 2017-04-10 MED ORDER — SCOPOLAMINE 1 MG/3DAYS TD PT72
MEDICATED_PATCH | TRANSDERMAL | Status: DC | PRN
  Administered 2017-04-10: 1 via TRANSDERMAL

## 2017-04-10 MED ORDER — SOD CITRATE-CITRIC ACID 500-334 MG/5ML PO SOLN
30.0000 mL | ORAL | Status: AC
  Administered 2017-04-10: 30 mL via ORAL
  Filled 2017-04-10: qty 15

## 2017-04-10 MED ORDER — NALOXONE HCL 0.4 MG/ML IJ SOLN: 0.4000 mg | INTRAMUSCULAR | Status: DC | PRN

## 2017-04-10 MED ORDER — SODIUM CHLORIDE 0.9 % IV SOLN: INTRAVENOUS | Status: DC

## 2017-04-10 MED ORDER — MORPHINE SULFATE (PF) 0.5 MG/ML IJ SOLN
INTRAMUSCULAR | Status: AC
  Filled 2017-04-10: qty 10

## 2017-04-10 MED ORDER — SCOPOLAMINE 1 MG/3DAYS TD PT72: 1.0000 | MEDICATED_PATCH | Freq: Once | TRANSDERMAL | Status: DC

## 2017-04-10 MED ORDER — DIPHENHYDRAMINE HCL 25 MG PO CAPS: 25.0000 mg | ORAL_CAPSULE | ORAL | Status: DC | PRN

## 2017-04-10 MED ORDER — SCOPOLAMINE 1 MG/3DAYS TD PT72
MEDICATED_PATCH | TRANSDERMAL | Status: AC
  Filled 2017-04-10: qty 1

## 2017-04-10 MED ORDER — DIPHENHYDRAMINE HCL 25 MG PO CAPS: 25.0000 mg | ORAL_CAPSULE | Freq: Four times a day (QID) | ORAL | Status: DC | PRN

## 2017-04-10 MED ORDER — SIMETHICONE 80 MG PO CHEW: 80.0000 mg | CHEWABLE_TABLET | ORAL | Status: DC | PRN

## 2017-04-10 MED ORDER — FENTANYL CITRATE (PF) 100 MCG/2ML IJ SOLN
INTRAMUSCULAR | Status: AC
  Filled 2017-04-10: qty 2

## 2017-04-10 MED ORDER — SIMETHICONE 80 MG PO CHEW
80.0000 mg | CHEWABLE_TABLET | ORAL | Status: DC
  Administered 2017-04-10 – 2017-04-11 (×2): 80 mg via ORAL
  Filled 2017-04-10 (×2): qty 1

## 2017-04-10 MED ORDER — EPHEDRINE 5 MG/ML INJ
INTRAVENOUS | Status: AC
  Administered 2017-04-10: 10 mg
  Filled 2017-04-10: qty 4

## 2017-04-10 MED ORDER — MENTHOL 3 MG MT LOZG
1.0000 | LOZENGE | OROMUCOSAL | Status: DC | PRN
  Filled 2017-04-10: qty 9

## 2017-04-10 MED ORDER — KETOROLAC TROMETHAMINE 30 MG/ML IJ SOLN
30.0000 mg | Freq: Four times a day (QID) | INTRAMUSCULAR | Status: AC | PRN
  Administered 2017-04-10: 30 mg via INTRAMUSCULAR

## 2017-04-10 MED ORDER — WITCH HAZEL-GLYCERIN EX PADS: 1.0000 "application " | MEDICATED_PAD | CUTANEOUS | Status: DC | PRN

## 2017-04-10 MED ORDER — NALOXONE HCL 4 MG/10ML IJ SOLN: 1.0000 ug/kg/h | INTRAVENOUS | Status: DC | PRN

## 2017-04-10 MED ORDER — ONDANSETRON HCL 4 MG/2ML IJ SOLN: 4.0000 mg | Freq: Three times a day (TID) | INTRAMUSCULAR | Status: DC | PRN

## 2017-04-10 MED ORDER — DM-GUAIFENESIN ER 30-600 MG PO TB12
1.0000 | ORAL_TABLET | Freq: Two times a day (BID) | ORAL | Status: DC
  Administered 2017-04-10 – 2017-04-12 (×5): 1 via ORAL
  Filled 2017-04-10 (×7): qty 1

## 2017-04-10 MED ORDER — PHENYLEPHRINE 40 MCG/ML (10ML) SYRINGE FOR IV PUSH (FOR BLOOD PRESSURE SUPPORT)
PREFILLED_SYRINGE | INTRAVENOUS | Status: AC
  Filled 2017-04-10: qty 90

## 2017-04-10 MED ORDER — OXYTOCIN 40 UNITS IN LACTATED RINGERS INFUSION - SIMPLE MED: 2.5000 [IU]/h | INTRAVENOUS | Status: AC

## 2017-04-10 MED ORDER — NON FORMULARY: 300.0000 mg | Freq: Two times a day (BID) | Status: DC

## 2017-04-10 MED ORDER — DIBUCAINE 1 % RE OINT: 1.0000 "application " | TOPICAL_OINTMENT | RECTAL | Status: DC | PRN

## 2017-04-10 MED ORDER — SODIUM CHLORIDE 0.9 % IR SOLN
Status: DC | PRN
  Administered 2017-04-10: 1

## 2017-04-10 MED ORDER — GENTAMICIN SULFATE 40 MG/ML IJ SOLN
INTRAVENOUS | Status: AC
  Administered 2017-04-10: 114 mL via INTRAVENOUS
  Filled 2017-04-10: qty 8.25

## 2017-04-10 MED ORDER — ONDANSETRON HCL 4 MG/2ML IJ SOLN
INTRAMUSCULAR | Status: DC | PRN
  Administered 2017-04-10: 4 mg via INTRAVENOUS

## 2017-04-10 MED ORDER — TRAMADOL HCL 50 MG PO TABS
50.0000 mg | ORAL_TABLET | Freq: Four times a day (QID) | ORAL | Status: DC | PRN
  Administered 2017-04-11 – 2017-04-12 (×3): 50 mg via ORAL
  Filled 2017-04-10 (×3): qty 1

## 2017-04-10 MED ORDER — DEXTROSE IN LACTATED RINGERS 5 % IV SOLN
INTRAVENOUS | Status: DC
  Administered 2017-04-10 (×2): via INTRAVENOUS

## 2017-04-10 MED ORDER — FENTANYL CITRATE (PF) 100 MCG/2ML IJ SOLN
INTRAMUSCULAR | Status: DC | PRN
  Administered 2017-04-10: 20 ug via INTRATHECAL

## 2017-04-10 MED ORDER — COCONUT OIL OIL
1.0000 "application " | TOPICAL_OIL | Status: DC | PRN
  Filled 2017-04-10: qty 120

## 2017-04-10 MED ORDER — PHENYLEPHRINE HCL 10 MG/ML IJ SOLN
INTRAMUSCULAR | Status: DC | PRN
  Administered 2017-04-10: 80 ug via INTRAVENOUS

## 2017-04-10 MED ORDER — MORPHINE SULFATE (PF) 0.5 MG/ML IJ SOLN
INTRAMUSCULAR | Status: DC | PRN
  Administered 2017-04-10: .2 mg via INTRATHECAL

## 2017-04-10 MED ORDER — PRENATAL MULTIVITAMIN CH
1.0000 | ORAL_TABLET | Freq: Every day | ORAL | Status: DC
  Administered 2017-04-11 – 2017-04-12 (×2): 1 via ORAL
  Filled 2017-04-10 (×2): qty 1

## 2017-04-10 MED ORDER — IBUPROFEN 600 MG PO TABS
600.0000 mg | ORAL_TABLET | Freq: Four times a day (QID) | ORAL | Status: DC
  Administered 2017-04-10 – 2017-04-12 (×8): 600 mg via ORAL
  Filled 2017-04-10 (×8): qty 1

## 2017-04-10 SURGICAL SUPPLY — 35 items
ADH SKN CLS APL DERMABOND .7 (GAUZE/BANDAGES/DRESSINGS)
APL SKNCLS STERI-STRIP NONHPOA (GAUZE/BANDAGES/DRESSINGS) ×1
BENZOIN TINCTURE PRP APPL 2/3 (GAUZE/BANDAGES/DRESSINGS) ×1 IMPLANT
CHLORAPREP W/TINT 26ML (MISCELLANEOUS) ×2 IMPLANT
CLAMP CORD UMBIL (MISCELLANEOUS) IMPLANT
CLOSURE STERI STRIP 1/2 X4 (GAUZE/BANDAGES/DRESSINGS) ×1 IMPLANT
CLOTH BEACON ORANGE TIMEOUT ST (SAFETY) ×2 IMPLANT
DERMABOND ADVANCED (GAUZE/BANDAGES/DRESSINGS)
DERMABOND ADVANCED .7 DNX12 (GAUZE/BANDAGES/DRESSINGS) IMPLANT
DRSG OPSITE POSTOP 4X10 (GAUZE/BANDAGES/DRESSINGS) ×2 IMPLANT
ELECT REM PT RETURN 9FT ADLT (ELECTROSURGICAL) ×2
ELECTRODE REM PT RTRN 9FT ADLT (ELECTROSURGICAL) ×1 IMPLANT
EXTRACTOR VACUUM M CUP 4 TUBE (SUCTIONS) ×1 IMPLANT
GLOVE BIO SURGEON STRL SZ7.5 (GLOVE) ×2 IMPLANT
GLOVE BIOGEL PI IND STRL 7.0 (GLOVE) ×1 IMPLANT
GLOVE BIOGEL PI INDICATOR 7.0 (GLOVE) ×1
GOWN STRL REUS W/TWL LRG LVL3 (GOWN DISPOSABLE) ×4 IMPLANT
KIT ABG SYR 3ML LUER SLIP (SYRINGE) ×3 IMPLANT
NDL HYPO 25X5/8 SAFETYGLIDE (NEEDLE) ×1 IMPLANT
NEEDLE HYPO 25X5/8 SAFETYGLIDE (NEEDLE) ×4 IMPLANT
NS IRRIG 1000ML POUR BTL (IV SOLUTION) ×2 IMPLANT
PACK C SECTION WH (CUSTOM PROCEDURE TRAY) ×2 IMPLANT
PAD OB MATERNITY 4.3X12.25 (PERSONAL CARE ITEMS) ×2 IMPLANT
PENCIL SMOKE EVAC W/HOLSTER (ELECTROSURGICAL) ×2 IMPLANT
STRIP CLOSURE SKIN 1/2X4 (GAUZE/BANDAGES/DRESSINGS) IMPLANT
SUT MNCRL 0 VIOLET CTX 36 (SUTURE) ×4 IMPLANT
SUT MONOCRYL 0 CTX 36 (SUTURE) ×4
SUT PDS AB 0 CTX 60 (SUTURE) ×2 IMPLANT
SUT PLAIN 0 NONE (SUTURE) IMPLANT
SUT PLAIN 2 0 (SUTURE)
SUT PLAIN 2 0 XLH (SUTURE) IMPLANT
SUT PLAIN ABS 2-0 CT1 27XMFL (SUTURE) IMPLANT
SUT VIC AB 4-0 KS 27 (SUTURE) ×2 IMPLANT
TOWEL OR 17X24 6PK STRL BLUE (TOWEL DISPOSABLE) ×2 IMPLANT
TRAY FOLEY BAG SILVER LF 14FR (SET/KITS/TRAYS/PACK) ×2 IMPLANT

## 2017-04-10 NOTE — Anesthesia Procedure Notes (Signed)
Spinal  Patient location during procedure: OR Start time: 04/10/2017 7:27 AM End time: 04/10/2017 7:30 AM Staffing Anesthesiologist: Leilani AbleHatchett, Chandlor Noecker, MD Performed: anesthesiologist  Preanesthetic Checklist Completed: patient identified, site marked, surgical consent, pre-op evaluation, timeout performed, IV checked, risks and benefits discussed and monitors and equipment checked Spinal Block Patient position: sitting Prep: site prepped and draped and DuraPrep Patient monitoring: continuous pulse ox and blood pressure Approach: midline Location: L3-4 Injection technique: single-shot Needle Needle type: Pencan  Needle gauge: 24 G Needle length: 10 cm Needle insertion depth: 5 cm Assessment Sensory level: T4

## 2017-04-10 NOTE — Brief Op Note (Signed)
04/10/2017  8:26 AM  PATIENT:  Rebecca Schroeder  27 y.o. female  PRE-OPERATIVE DIAGNOSIS:  previous X 1  POST-OPERATIVE DIAGNOSIS:  previous X 1  PROCEDURE:  Procedure(s) with comments: REPEAT CESAREAN SECTION (N/A) - Repeat edc 04/17/17 allergy to codeine, hydrocodone, PCN need RNFA.Marland Kitchen.Marland Kitchen.Marland Kitchen.Heather Medical laboratory scientific officerKrietemeyer RNFA  SURGEON:  Surgeon(s) and Role:    * Harold Hedgeomblin, Brookes Craine, MD - Primary  PHYSICIAN ASSISTANT:   ASSISTANTS: none   ANESTHESIA:   spinal  EBL:  700 mL   BLOOD ADMINISTERED:none  DRAINS: Urinary Catheter (Foley)   LOCAL MEDICATIONS USED:  NONE  SPECIMEN:  Source of Specimen:  placenta  DISPOSITION OF SPECIMEN:  PATHOLOGY  COUNTS:  YES  TOURNIQUET:  * No tourniquets in log *  DICTATION: .Other Dictation: Dictation Number [id number]  PLAN OF CARE: Admit to inpatient   PATIENT DISPOSITION:  PACU - hemodynamically stable.   Delay start of Pharmacological VTE agent (>24hrs) due to surgical blood loss or risk of bleeding: not applicable

## 2017-04-10 NOTE — Anesthesia Preprocedure Evaluation (Signed)
Anesthesia Evaluation  Patient identified by MRN, date of birth, ID band Patient awake    Reviewed: Allergy & Precautions, H&P , NPO status , Patient's Chart, lab work & pertinent test results  Airway Mallampati: II  TM Distance: >3 FB Neck ROM: full    Dental no notable dental hx. (+) Teeth Intact   Pulmonary neg pulmonary ROS,    Pulmonary exam normal breath sounds clear to auscultation       Cardiovascular negative cardio ROS Normal cardiovascular exam Rhythm:regular Rate:Normal     Neuro/Psych negative neurological ROS  negative psych ROS   GI/Hepatic negative GI ROS, Neg liver ROS,   Endo/Other  negative endocrine ROS  Renal/GU negative Renal ROS  negative genitourinary   Musculoskeletal   Abdominal (+) + obese,   Peds  Hematology  (+) Blood dyscrasia, anemia ,   Anesthesia Other Findings   Reproductive/Obstetrics (+) Pregnancy                             Anesthesia Physical Anesthesia Plan  ASA: II  Anesthesia Plan: Spinal   Post-op Pain Management:    Induction:   PONV Risk Score and Plan: 3 and Ondansetron, Dexamethasone and Scopolamine patch - Pre-op  Airway Management Planned: Natural Airway and Nasal Cannula  Additional Equipment:   Intra-op Plan:   Post-operative Plan:   Informed Consent: I have reviewed the patients History and Physical, chart, labs and discussed the procedure including the risks, benefits and alternatives for the proposed anesthesia with the patient or authorized representative who has indicated his/her understanding and acceptance.     Plan Discussed with: CRNA and Surgeon  Anesthesia Plan Comments:         Anesthesia Quick Evaluation

## 2017-04-10 NOTE — Anesthesia Postprocedure Evaluation (Signed)
Anesthesia Post Note  Patient: Rebecca Schroeder  Procedure(s) Performed: REPEAT CESAREAN SECTION (N/A Abdomen)     Patient location during evaluation: Mother Baby Anesthesia Type: Spinal Level of consciousness: awake and alert Pain management: pain level controlled Vital Signs Assessment: post-procedure vital signs reviewed and stable Respiratory status: spontaneous breathing, nonlabored ventilation and respiratory function stable Cardiovascular status: stable Postop Assessment: no headache, no backache and epidural receding Anesthetic complications: no    Last Vitals:  Vitals:   04/10/17 1320 04/10/17 1521  BP: (!) 104/56 (!) 99/55  Pulse: 63 66  Resp: 20 18  Temp: (!) 36.4 C 36.8 C  SpO2: 97% 97%    Last Pain:  Vitals:   04/10/17 1521  TempSrc: Axillary  PainSc: 2    Pain Goal:                 Evva Din

## 2017-04-10 NOTE — Addendum Note (Signed)
Addendum  created 04/10/17 1348 by Algis GreenhouseBurger, Miniya Miguez A, CRNA   Sign clinical note

## 2017-04-10 NOTE — Transfer of Care (Signed)
Immediate Anesthesia Transfer of Care Note  Patient: Rebecca BrightlyCourtney M Cavitt  Procedure(s) Performed: REPEAT CESAREAN SECTION (N/A Abdomen)  Patient Location: PACU  Anesthesia Type:Spinal  Level of Consciousness: awake, alert  and oriented  Airway & Oxygen Therapy: Patient Spontanous Breathing  Post-op Assessment: Report given to RN and Post -op Vital signs reviewed and stable  Post vital signs: Reviewed and stable  Last Vitals:  Vitals:   04/10/17 0608 04/10/17 0611  BP: 124/88   Pulse: 78   Resp:    Temp:    SpO2:  98%    Last Pain:  Vitals:   04/10/17 0605  TempSrc: Oral         Complications: No apparent anesthesia complications

## 2017-04-10 NOTE — Anesthesia Postprocedure Evaluation (Signed)
Anesthesia Post Note  Patient: Rebecca Schroeder  Procedure(s) Performed: REPEAT CESAREAN SECTION (N/A Abdomen)     Patient location during evaluation: PACU Anesthesia Type: Spinal Level of consciousness: awake Pain management: pain level controlled Vital Signs Assessment: post-procedure vital signs reviewed and stable Respiratory status: spontaneous breathing Cardiovascular status: stable Postop Assessment: no headache, no backache, spinal receding, patient able to bend at knees and no apparent nausea or vomiting Anesthetic complications: no    Last Vitals:  Vitals:   04/10/17 0957 04/10/17 1005  BP:  125/69  Pulse: 74 62  Resp: (!) 21 20  Temp:  (!) 36.3 C  SpO2: 100% 100%    Last Pain:  Vitals:   04/10/17 1005  TempSrc: Oral   Pain Goal:                 [name] JR,[name]

## 2017-04-10 NOTE — Op Note (Signed)
NAME:  Rebecca Schroeder, Rebecca Schroeder                  ACCOUNT NO.:  MEDICAL RECORD NO.:  [id number]  LOCATION:                                 FACILITY:  PHYSICIAN:  Guy SandiferJames E. Henderson Cloudomblin, M.D.      DATE OF BIRTH:  DATE OF PROCEDURE:  04/10/2017 DATE OF DISCHARGE:                              OPERATIVE REPORT   PREOPERATIVE DIAGNOSIS:  Preoperative cesarean section, desires repeat.  POSTOPERATIVE DIAGNOSIS:  Previous cesarean section, desires repeat.  PROCEDURE:  Repeat low transverse cesarean section.  SURGEON:  Guy SandiferJames E. Henderson Cloudomblin, M.D.  ANESTHESIA:  Spinal.  ANESTHESIOLOGIST:  Leilani AbleFranklin Hatchett, M.D.  ESTIMATED BLOOD LOSS:  700 mL.  SPECIMENS:  Placenta to Pathology.  FINDINGS:  Viable female infant.  Apgars, arterial cord pH, weight pending.  INDICATIONS AND CONSENT:  This patient is a 27 year old, G2, P1, at 5839- 0/7th weeks.  She has had previous cesarean section.  After discussion of options, she desires repeat.  She has also had headache and blurry vision on and off for the last 2-4 weeks.  Repeated evaluations have shown normal labs.  The procedure was reviewed preoperatively including risks including, but not limited to, infection, organ damage, bleeding requiring transfusion of blood products with HIV and hepatitis acquisition, DVT, PE, pneumonia.  The patient states she understands and agrees and consent was signed on the chart.  DESCRIPTION OF PROCEDURE:  The patient was taken to the operating room where she was identified.  Spinal anesthetic was placed and she was placed in a dorsal supine position with 15 degree left lateral wedge. She was prepped vaginally with Betadine.  Foley catheter was placed and prepped abdominally with ChloraPrep.  After 3 minute drying time, she was draped in sterile fashion.  Time-out was undertaken.  After testing for adequate spinal anesthesia, skin was entered through the previous Pfannenstiel scar and the scar was removed on the way in.   Dissection was carried out in layers to the peritoneum, which was taken down superiorly and inferiorly.  Vesicouterine peritoneum was taken down cephalad laterally.  Bladder flap developed and the bladder blade placed.  Uterus was incised in a low transverse manner.  The uterine cavity was entered bluntly with a hemostat.  Clear fluid was noted.  The incision was extended with the fingers.  Vertex was delivered through the incision with a vacuum extractor with gentle traction and no pop offs.  Baby was then delivered without difficulty.  Good cry and tone were noted.  After 1 minute waiting time, the cord was clamped and cut. The baby was handed to awaiting pediatrics team.  Placenta was manually delivered and sent to Pathology.  Uterine cavity was clean.  Uterus was closed in 2 running locking imbricating layers of 0 Monocryl suture, which achieved good hemostasis.  Lavage was carried out.  Anterior peritoneum was closed in a running fashion with 0 Monocryl suture, which was also used to reapproximate the pyramidalis muscle in midline.  Anterior rectus fascia was closed in a running fashion with a 0 looped PDS.  Subcutaneous layer was closed with interrupted 2-0 plain and the skin was closed in a subcuticular fashion with a 4-0 Vicryl on  a Mellody Dance needle.  All counts were correct. The patient was taken to recovery room in stable condition.     Guy Sandifer Henderson Cloud, M.D.     JET/MEDQ  D:  04/10/2017  T:  04/10/2017  Job:  [id number]

## 2017-04-10 NOTE — Consult Note (Signed)
Neonatology Note:   Attendance at C-section:    I was asked by Dr. Tomblin to attend this repeat C/S at term. The mother is a G2P1 A pos, GBS positive with an uncomplicated pregnancy. ROM at delivery, fluid clear. Infant vigorous with good spontaneous cry and tone. Delayed cord clamping was done. Needed only minimal bulb suctioning. Ap 9/9. Lungs clear to ausc in DR. Infant is able to remain with his mother for skin to skin time under nursing supervision. Transferred to the care of Pediatrician.   Jasmyn Picha C. Felicidad Sugarman, MD   

## 2017-04-10 NOTE — Anesthesia Postprocedure Evaluation (Signed)
Anesthesia Post Note  Patient: Rebecca Schroeder  Procedure(s) Performed: REPEAT CESAREAN SECTION (N/A Abdomen)     Patient location during evaluation: Mother Baby Anesthesia Type: Spinal Level of consciousness: awake Pain management: satisfactory to patient Vital Signs Assessment: post-procedure vital signs reviewed and stable Respiratory status: spontaneous breathing Cardiovascular status: stable Anesthetic complications: no    Last Vitals:  Vitals:   04/10/17 1120 04/10/17 1320  BP: (!) 108/55 (!) 104/56  Pulse: (!) 56 63  Resp: 18 20  Temp: 36.5 C (!) 36.4 C  SpO2: 99% 97%    Last Pain:  Vitals:   04/10/17 1320  TempSrc: Oral  PainSc: 4    Pain Goal:                 [name],[name]

## 2017-04-10 NOTE — Progress Notes (Signed)
No changes to H&P per patient history Reviewed with patient procedure-repeat cesarean section All questions answered, patient states she understands and agrees.

## 2017-04-10 NOTE — Progress Notes (Signed)
Pt bringing cednefir from home.

## 2017-04-10 NOTE — Addendum Note (Signed)
Addendum  created 04/10/17 1716 by Orlie PollenMerritt, Wade Asebedo R, CRNA   Sign clinical note

## 2017-04-11 LAB — CBC
HEMATOCRIT: 26.1 % — AB (ref 36.0–46.0)
Hemoglobin: 8.3 g/dL — ABNORMAL LOW (ref 12.0–15.0)
MCH: 25.9 pg — ABNORMAL LOW (ref 26.0–34.0)
MCHC: 31.8 g/dL (ref 30.0–36.0)
MCV: 81.3 fL (ref 78.0–100.0)
Platelets: 156 10*3/uL (ref 150–400)
RBC: 3.21 MIL/uL — ABNORMAL LOW (ref 3.87–5.11)
RDW: 15.7 % — ABNORMAL HIGH (ref 11.5–15.5)
WBC: 11.5 10*3/uL — AB (ref 4.0–10.5)

## 2017-04-11 MED ORDER — POLYSACCHARIDE IRON COMPLEX 150 MG PO CAPS
150.0000 mg | ORAL_CAPSULE | Freq: Every day | ORAL | Status: DC
  Administered 2017-04-11 – 2017-04-12 (×2): 150 mg via ORAL
  Filled 2017-04-11 (×2): qty 1

## 2017-04-11 MED ORDER — FAMOTIDINE 20 MG PO TABS
20.0000 mg | ORAL_TABLET | Freq: Two times a day (BID) | ORAL | Status: DC
  Administered 2017-04-11 – 2017-04-12 (×3): 20 mg via ORAL
  Filled 2017-04-11 (×3): qty 1

## 2017-04-11 MED ORDER — DEXTROSE 5 % IN LACTATED RINGERS IV BOLUS: 300.0000 mL | Freq: Once | INTRAVENOUS | Status: DC

## 2017-04-11 MED ORDER — LACTATED RINGERS IV SOLN
INTRAVENOUS | Status: DC
  Administered 2017-04-11: 999 mL via INTRAVENOUS

## 2017-04-11 MED ORDER — DEXTROSE IN LACTATED RINGERS 5 % IV SOLN: INTRAVENOUS | Status: DC

## 2017-04-11 NOTE — Lactation Note (Signed)
This note was copied from a baby's chart. Lactation Consultation Note  Patient Name: Rebecca Schroeder UJWJX'BToday's Date: 04/11/2017 Reason for consult: Initial assessment;Term  Visited with P2 Mom of term baby at 7535 hrs old, baby at 5% weight loss.  Mom denies any difficulty latching, except for transient soreness on initial latch.   Baby positioned in cradle hold, slightly adjusted baby's lower jaw/lip with a chin tug, to open mouth wider.  Demonstrated alternate breast compression and baby became more vigorous with identifiable regular swallows. Encouraged continued STS, and feeding often on cue.  Goal of *-12 feedings in 24 hrs.  Mom [employer] with [employer] insurance.  Medela PIS given to Mom.  Lactation brochure left in room.. Mom aware of IP and OP Lactation support available to her.  Encouraged her to call prn.  Maternal Data Formula Feeding for Exclusion: No Has patient been taught Hand Expression?: Yes Does the patient have breastfeeding experience prior to this delivery?: Yes  Feeding Feeding Type: Breast Fed Length of feed: 25 min  LATCH Score Latch: Grasps breast easily, tongue down, lips flanged, rhythmical sucking.  Audible Swallowing: Spontaneous and intermittent  Type of Nipple: Everted at rest and after stimulation  Comfort (Breast/Nipple): Soft / non-tender  Hold (Positioning): No assistance needed to correctly position infant at breast.  LATCH Score: 10  Interventions Interventions: Breast feeding basics reviewed;Skin to skin;Breast massage;Hand express;Support pillows  Lactation Tools Discussed/Used WIC Program: No   Consult Status Consult Status: Follow-up Date: 04/12/17 Follow-up type: In-patient    Judee ClaraSmith, Keiasia Christianson E 04/11/2017, 7:05 PM

## 2017-04-11 NOTE — Progress Notes (Signed)
CBC results called in to Dr Marcelle OverlieHolland as soon as available.  New orders: 300 cc bolus of D5LR for 20 minutes, then 14950ml/hour.  When bag runs out, can continue with LR at 16550ml/hr.

## 2017-04-11 NOTE — Progress Notes (Signed)
Subjective: Postpartum Day 1: Cesarean Delivery Patient reports incisional pain and tolerating PO.  C/o refulx Objective: Vital signs in last 24 hours: Temp:  [97.4 F (36.3 C)-98.6 F (37 C)] 98 F (36.7 C) (03/07 0323) Pulse Rate:  [56-80] 68 (03/07 0323) Resp:  [13-30] 18 (03/07 0323) BP: (99-126)/(52-72) 111/62 (03/07 0323) SpO2:  [90 %-100 %] 90 % (03/07 0323)  Physical Exam:  General: alert and cooperative Lochia: appropriate Uterine Fundus: firm Incision: healing well, no significant drainage DVT Evaluation: No evidence of DVT seen on physical exam.  Recent Labs    04/10/17 1214 04/11/17 0056  HGB 9.8* 8.3*  HCT 30.2* 26.1*    Assessment/Plan: Status post Cesarean section. Doing well postoperatively.  Continue current care. Anemia from blood loss - iron supp ordered Reflux - pepcid Rebecca Schroeder 04/11/2017, 8:42 AM

## 2017-04-11 NOTE — Progress Notes (Signed)
CSW attempted to meet with MOB to offer support and complete assessment due to hx of Anxiety and Depression, however, she has had multiple visitors today and states she plans to have visitors the rest of the afternoon.  She requests that CSW return tomorrow.  CSW agreed and will attempt again if possible.

## 2017-04-11 NOTE — Progress Notes (Signed)
Dr Elon SpannerLeger was mistakenly called for patient concern.  This nurse called L&D to find out who was on call for [facility] because of dated listing.  Dr Marcelle OverlieHolland is on call, notified for this patient immediately.

## 2017-04-12 MED ORDER — IBUPROFEN 600 MG PO TABS: 600.0000 mg | ORAL_TABLET | Freq: Four times a day (QID) | ORAL | 0 refills | Status: DC

## 2017-04-12 MED ORDER — TRAMADOL HCL 50 MG PO TABS: 50.0000 mg | ORAL_TABLET | Freq: Four times a day (QID) | ORAL | 0 refills | Status: DC | PRN

## 2017-04-12 MED FILL — traMADol HCL 50 MG TABS: 50 | 7 days supply | Qty: 30 | Fill #0

## 2017-04-12 MED FILL — IBUPROFEN 600 MG TABLET: 600 | 7 days supply | Qty: 30 | Fill #0

## 2017-04-12 NOTE — Discharge Summary (Signed)
Obstetric Discharge Summary Reason for Admission: cesarean section Prenatal Procedures: none Intrapartum Procedures: cesarean: low cervical, transverse Postpartum Procedures: none Complications-Operative and Postpartum: none Hemoglobin  Date Value Ref Range Status  04/11/2017 8.3 (L) 12.0 - 15.0 g/dL Final   HCT  Date Value Ref Range Status  04/11/2017 26.1 (L) 36.0 - 46.0 % Final    Physical Exam:  General: alert, cooperative and appears stated age 5Lochia: appropriate Uterine Fundus: firm Incision: healing well, no significant drainage, no dehiscence DVT Evaluation: No evidence of DVT seen on physical exam. Negative Homan's sign. No cords or calf tenderness.  Discharge Diagnoses: Term Pregnancy-delivered  Discharge Information: Date: 04/12/2017 Activity: pelvic rest Diet: routine Medications: PNV, Ibuprofen and Ultram Condition: stable Instructions: refer to practice specific booklet Discharge to: home   Newborn Data: Live born female  Birth Weight: 6 lb 7 oz (2920 g) APGAR: 9, 9  Newborn Delivery   Birth date/time:  04/10/2017 07:53:00 Delivery type:  C-Section, Vacuum Assisted C-section categorization:  Repeat     Home with mother.  Rebecca Schroeder 04/12/2017, 10:07 AM

## 2017-04-12 NOTE — Lactation Note (Signed)
This note was copied from a baby's chart. Lactation Consultation Note  Patient Name: Boy Yolande JollyCourtney Kempner WUJWJ'XToday's Date: 04/12/2017 Reason for consult: Follow-up assessment;Infant weight loss(9 % weight loss ) Baby is 6352 hours old  LC reviewed doc flow sheets, and updated per mom and dad.  LC discussed weight loss and recommended for mom to add post pumping after  4 -5 feedings day for  10 -15 mins to enhance the volume increasing and the fatty milk  Supplement back to baby.  Mom has her DEBP Medela in the room.  LC suggested breast massage , hand express, prep pump with hand pump and mom declined  Hand pump . Having some soreness initially with latch. LC instructed mom on the use shells,  And comfort gals for 6 days once applied to nipples.  Sore  Nipple and engorgement prevention and tx reviewed.  LC stressed the importance of STS feedings until the baby can stay awake for feedings.  Nutritive vs non - nutritive feeding patterns and watch for the baby hanging out latched.  Per mom will be going back for weight check tomorrow.  [facility].  Mother informed of post-discharge support and given phone number to the lactation department, including services for phone call assistance; out-patient appointments; and breastfeeding support group. List of other breastfeeding resources in the community given in the handout. Encouraged mother to call for problems or concerns related to breastfeeding.      Maternal Data Has patient been taught Hand Expression?: Yes  Feeding Feeding Type: Breast Fed Length of feed: 30 min(per parents swallows )  LATCH Score                   Interventions Interventions: Breast feeding basics reviewed  Lactation Tools Discussed/Used Tools: Shells Shell Type: Inverted Pump Review: Setup, frequency, and cleaning;Milk Storage Initiated by:: LC reviewed /  Date initiated:: 04/12/17   Consult Status Consult Status: Complete Date:  04/12/17    Kathrin GreathouseMargaret Ann Brylyn Novakovich 04/12/2017, 12:42 PM

## 2017-04-12 NOTE — Discharge Instructions (Signed)
Call MD for T>100.4, heavy vaginal bleeding, severe abdominal pain, intractable nausea and/or vomiting, or respiratory distress.  Call office to schedule postop incision check in 1-2 weeks.  Pelvic rest x 6 weeks.  No driving while taking narcotics.   °

## 2017-04-12 NOTE — Progress Notes (Signed)
CSW received consult for hx of Anxiety/Depression.  CSW met with MOB in room 141 to offer support and complete assessment.    When CSW arrived MOB was bonding with infant as evidence by engaging in breastfeeding.  MOB appeared happy and comfortable taking care of infant. MOB was polite, forthcoming, and receptive to meeting with CSW.    CSW asked about MOB's MH hx and MOB acknowledged being dx with anxiety/depression after the birth of MOB's oldest son.  MOB shared that MOB tried different medications but that all made MOB present with side effects. MOB denied having symptoms during this pregnancy and reported feeling good overall.  MOB reports have a strong support team that consist of MOB's and FOB's family.  MOB also shared that a lot of MOB's stressors was due to FOB SA.  MOB stated that FOB's issues have since resolve and they are in a healthy and happy relationship.   CSW provided education regarding the baby blues  vs. perinatal mood disorders, discussed treatment and gave resources for mental health follow up if concerns arise.  CSW recommends self-evaluation during the postpartum time period using the New Mom Checklist from Postpartum Progress and encouraged MOB to contact a medical professional if symptoms are noted at any time. CSW assessed for safety and MOB denied SI, HI, and DV.     CSW provided review of Sudden Infant Death Syndrome (SIDS) precautions.    CSW identifies no further need for intervention and no barriers to discharge at this time.  Laurey Arrow, MSW, LCSW Clinical Social Work [phone]

## 2017-05-29 MED FILL — NORETHINDRONE 0.35 MG TAB: 0.35 | 84 days supply | Qty: 84 | Fill #0

## 2017-06-19 ENCOUNTER — Ambulatory Visit (INDEPENDENT_AMBULATORY_CARE_PROVIDER_SITE_OTHER): Payer: No Typology Code available for payment source | Admitting: Family Medicine

## 2017-06-19 ENCOUNTER — Encounter: Payer: Self-pay | Admitting: Family Medicine

## 2017-06-19 ENCOUNTER — Other Ambulatory Visit: Payer: Self-pay

## 2017-06-19 VITALS — BP 118/78 | HR 70 | Temp 98.2°F | Ht 62.0 in | Wt 179.0 lb

## 2017-06-19 DIAGNOSIS — Z0000 Encounter for general adult medical examination without abnormal findings: Secondary | ICD-10-CM | POA: Diagnosis not present

## 2017-06-19 LAB — CBC WITH DIFFERENTIAL/PLATELET
BASOS PCT: 0.4 %
Basophils Absolute: 36 cells/uL (ref 0–200)
Eosinophils Absolute: 89 cells/uL (ref 15–500)
Eosinophils Relative: 1 %
HCT: 39.1 % (ref 35.0–45.0)
Hemoglobin: 13.1 g/dL (ref 11.7–15.5)
LYMPHS ABS: 2528 {cells}/uL (ref 850–3900)
MCH: 27 pg (ref 27.0–33.0)
MCHC: 33.5 g/dL (ref 32.0–36.0)
MCV: 80.5 fL (ref 80.0–100.0)
MPV: 10.5 fL (ref 7.5–12.5)
Monocytes Relative: 6.2 %
Neutro Abs: 5696 cells/uL (ref 1500–7800)
Neutrophils Relative %: 64 %
Platelets: 307 10*3/uL (ref 140–400)
RBC: 4.86 10*6/uL (ref 3.80–5.10)
RDW: 16.9 % — ABNORMAL HIGH (ref 11.0–15.0)
TOTAL LYMPHOCYTE: 28.4 %
WBC: 8.9 10*3/uL (ref 3.8–10.8)
WBCMIX: 552 {cells}/uL (ref 200–950)

## 2017-06-19 LAB — LIPID PANEL
Cholesterol: 159 mg/dL (ref <200)
HDL: 50 mg/dL — ABNORMAL LOW (ref >50)
LDL CHOLESTEROL (CALC): 93 mg/dL
Non-HDL Cholesterol (Calc): 109 mg/dL (calc) (ref <130)
TRIGLYCERIDES: 69 mg/dL (ref <150)
Total CHOL/HDL Ratio: 3.2 (calc) (ref <5.0)

## 2017-06-19 LAB — COMPREHENSIVE METABOLIC PANEL
AG Ratio: 1.6 (calc) (ref 1.0–2.5)
ALBUMIN MSPROF: 4.7 g/dL (ref 3.6–5.1)
ALKALINE PHOSPHATASE (APISO): 141 U/L — AB (ref 33–115)
ALT: 16 U/L (ref 6–29)
AST: 14 U/L (ref 10–30)
BUN: 13 mg/dL (ref 7–25)
CO2: 26 mmol/L (ref 20–32)
Calcium: 10 mg/dL (ref 8.6–10.2)
Chloride: 102 mmol/L (ref 98–110)
Creat: 0.76 mg/dL (ref 0.50–1.10)
GLOBULIN: 2.9 g/dL (ref 1.9–3.7)
Glucose, Bld: 77 mg/dL (ref 65–99)
POTASSIUM: 4.3 mmol/L (ref 3.5–5.3)
Sodium: 138 mmol/L (ref 135–146)
TOTAL PROTEIN: 7.6 g/dL (ref 6.1–8.1)
Total Bilirubin: 0.3 mg/dL (ref 0.2–1.2)

## 2017-06-19 MED ORDER — NORETHINDRONE 0.35 MG PO TABS: 1.0000 | ORAL_TABLET | Freq: Every day | ORAL | 11 refills | Status: DC

## 2017-06-19 NOTE — Patient Instructions (Addendum)
Release of records- [facility]- Last PAP Smear  F/U 1 year for Physical

## 2017-06-19 NOTE — Progress Notes (Signed)
   Subjective:    Patient ID: Rebecca Schroeder, female    DOB: Nov 26, 1990, 27 y.o.   MRN: [id number]  Patient presents for Annual Exam  Pt here for CPE PAP Smear UTD Immunizations UTD Recent birth- 2 month Post partum  Menses irregular on Micronor  And breast feeding   Trying to increase protein skips breakfast, tired with breastfeeding/newborn Denies feeling depresed or blue.  This AM Had biscuit and iced coffee this morning   Constipation-  Irregular , but not requiring any OTC meds    No new concerns    Review Of Systems:  GEN- denies fatigue, fever, weight loss,weakness, recent illness HEENT- denies eye drainage, change in vision, nasal discharge, CVS- denies chest pain, palpitations RESP- denies SOB, cough, wheeze ABD- denies N/V, change in stools, abd pain GU- denies dysuria, hematuria, dribbling, incontinence MSK- denies joint pain, muscle aches, injury Neuro- denies headache, dizziness, syncope, seizure activity       Objective:    BP 118/78   Pulse 70   Temp 98.2 F (36.8 C) (Oral)   Ht  (1.575 m)   Wt 179 lb (81.2 kg)   LMP 07/03/2016   SpO2 98%   BMI 32.74 kg/m  GEN- NAD, alert and oriented x3 HEENT- PERRL, EOMI, non injected sclera, pink conjunctiva, MMM, oropharynx clear Neck- Supple, no thyromegaly CVS- RRR, no murmur RESP-CTAB ABD-NABS,soft,NT,ND Psych- normal affect and mood  EXT- No edema Pulses- Radial, DP- 2+        Assessment & Plan:      Problem List Items Addressed This Visit    None    Visit Diagnoses    Routine general medical examination at a health care facility    -  Primary   CPE done, obtain records for PAP Smear. Non fasting labs done, immunizations UTD, on OCP per GYN   Relevant Orders   CBC with Differential/Platelet   Comprehensive metabolic panel   Lipid panel      Note: This dictation was prepared with Dragon dictation along with smaller phrase technology. Any transcriptional errors that result from  this process are unintentional.

## 2017-06-28 ENCOUNTER — Encounter: Payer: Self-pay | Admitting: *Deleted

## 2017-08-13 ENCOUNTER — Encounter: Payer: Self-pay | Admitting: Family Medicine

## 2017-08-13 ENCOUNTER — Ambulatory Visit (INDEPENDENT_AMBULATORY_CARE_PROVIDER_SITE_OTHER): Payer: No Typology Code available for payment source | Admitting: Family Medicine

## 2017-08-13 VITALS — BP 98/62 | HR 150 | Temp 98.6°F | Resp 14 | Ht 62.0 in | Wt 180.0 lb

## 2017-08-13 DIAGNOSIS — J329 Chronic sinusitis, unspecified: Secondary | ICD-10-CM | POA: Diagnosis not present

## 2017-08-13 DIAGNOSIS — H6591 Unspecified nonsuppurative otitis media, right ear: Secondary | ICD-10-CM

## 2017-08-13 DIAGNOSIS — J310 Chronic rhinitis: Secondary | ICD-10-CM

## 2017-08-13 DIAGNOSIS — R000 Tachycardia, unspecified: Secondary | ICD-10-CM | POA: Diagnosis not present

## 2017-08-13 MED ORDER — CEFDINIR 300 MG PO CAPS: 300.0000 mg | ORAL_CAPSULE | Freq: Two times a day (BID) | ORAL | 0 refills | Status: DC

## 2017-08-13 MED FILL — CEFDINIR 300 MG CAPSULE: 300 | 10 days supply | Qty: 20 | Fill #0

## 2017-08-13 NOTE — Progress Notes (Signed)
Subjective:    Patient ID: Rebecca Schroeder, female    DOB: 1990-02-12, 27 y.o.   MRN: [id number]  HPI   Patient is a very pleasant 27 year old Caucasian female who states her symptoms began approximately 1 week ago.  She reports pain and pressure in her right ear and also pain and pressure in the right side of her face greatest in the right frontal sinus but also in the right maxillary sinus.  She also has drainage and pain in the right side of her throat.  She is currently on prednisone for this through an electronic visit with another provider.  Yesterday, she developed a fever to 102.  Today her fever has improved however she is extremely tachycardic.  She also reports some mild dizziness.  On intake, her heart rate was 150 bpm.  After sitting and resting, her heart rate has slowed to 130 bpm.  I performed an EKG today.  Patient is in sinus tachycardia.  She has normal intervals and a normal axis.  There are nonspecific T wave inversions in the precordial leads but otherwise no evidence of ischemia or infarction.  Patient also has a mild goiter on exam.  She also has some mild tenderness to palpation in the anterior part of her neck raising concern for possible acute thyroiditis Past Medical History:  Diagnosis Date  . 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 Back pain    occasional that radiates down leg   Past Surgical History:  Procedure Laterality Date  . CESAREAN SECTION    . CESAREAN SECTION N/A 04/10/2017   Procedure: REPEAT CESAREAN SECTION;  Surgeon: Harold Hedgeomblin, James, MD;  [facility];  Service: Obstetrics;  Laterality: N/A;  Repeat edc 04/17/17 allergy to codeine, hydrocodone, PCN need RNFA.Marland Kitchen.Marland Kitchen.Marland Kitchen.Personnel officerHeather Krietemeyer RNFA  . TOE SURGERY    . WISDOM TOOTH EXTRACTION     Current Outpatient Medications on File Prior to Visit  Medication Sig Dispense Refill  . norethindrone (ORTHO MICRONOR) 0.35 MG tablet Take 1 tablet (0.35 mg total) by mouth daily. 1 Package 11  . Prenatal  Vit-Fe Fumarate-FA (MULTIVITAMIN-PRENATAL) 27-0.8 MG TABS tablet Take 1 tablet by mouth daily.     . methylPREDNISolone (MEDROL DOSEPAK) 4 MG TBPK tablet FPD  0   No current facility-administered medications on file prior to visit.    Allergies  Allergen Reactions  . Penicillins Other (See Comments) and Rash    Unknown Has patient had a PCN reaction causing immediate rash, facial/tongue/throat swelling, SOB or lightheadedness with hypotension: Unknown Has patient had a PCN reaction causing severe rash involving mucus membranes or skin necrosis: Unknown Has patient had a PCN reaction that required hospitalization: Unknown Has patient had a PCN reaction occurring within the last 10 years: Unknown If all of the above answers are "NO", then may proceed with Cephalosporin use. Childhood reaction.  . Codeine Nausea And Vomiting  . Hydrocodone-Acetaminophen Nausea And Vomiting  . Roxicodone [Oxycodone Hcl] Nausea And Vomiting and Other (See Comments)    SEVERE VOMITING----Patient DOES NOT tolerate with anti nausea medications   Social History   Socioeconomic History  . Marital status: Single    Spouse name: Not on file  . Number of children: Not on file  . Years of education: Not on file  . Highest education level: Not on file  Occupational History  . Not on file  Social Needs  . Financial resource strain: Not on file  . Food insecurity:    Worry: Not  on file    Inability: Not on file  . Transportation needs:    Medical: Not on file    Non-medical: Not on file  Tobacco Use  . Smoking status: Never Smoker  . Smokeless tobacco: Never Used  Substance and Sexual Activity  . Alcohol use: Yes    Alcohol/week: 0.0 oz    Comment: occasionally  . Drug use: No  . Sexual activity: Yes    Birth control/protection: IUD  Lifestyle  . Physical activity:    Days per week: Not on file    Minutes per session: Not on file  . Stress: Not on file  Relationships  . Social connections:     Talks on phone: Not on file    Gets together: Not on file    Attends religious service: Not on file    Active member of club or organization: Not on file    Attends meetings of clubs or organizations: Not on file    Relationship status: Not on file  . Intimate partner violence:    Fear of current or ex partner: Not on file    Emotionally abused: Not on file    Physically abused: Not on file    Forced sexual activity: Not on file  Other Topics Concern  . Not on file  Social History Narrative  . Not on file    Review of Systems  All other systems reviewed and are negative.      Objective:   Physical Exam  Constitutional: She appears well-developed and well-nourished. No distress.  HENT:  Right Ear: External ear normal. Tympanic membrane is erythematous and bulging. A middle ear effusion is present.  Left Ear: Tympanic membrane, external ear and ear canal normal.  Nose: Mucosal edema and rhinorrhea present. Right sinus exhibits maxillary sinus tenderness and frontal sinus tenderness. Left sinus exhibits no maxillary sinus tenderness and no frontal sinus tenderness.  Mouth/Throat: Oropharynx is clear and moist. No oropharyngeal exudate.  Eyes: Conjunctivae are normal.  Neck: Neck supple. Thyromegaly present.  Cardiovascular: Normal rate, regular rhythm and normal heart sounds.  Pulmonary/Chest: Effort normal and breath sounds normal. No respiratory distress. She has no wheezes. She has no rales.  Lymphadenopathy:    She has no cervical adenopathy.  Skin: She is not diaphoretic.  Vitals reviewed.         Assessment & Plan:  Tachycardia - Plan: TSH, CBC with Differential/Platelet, COMPLETE METABOLIC PANEL WITH GFR, EKG 12-Lead  Rhinosinusitis  Right otitis media with effusion Patient has tolerated cephalosporins in the past.  Therefore I will start her on Omnicef 300 mg p.o. twice daily for 10 days to treat her rhinosinusitis and right otitis media.  I recommended  discontinuation of prednisone.  I am more concerned about her tachycardia.  I believe this is likely due to a combination of her fever along with possible dehydration.  Therefore I recommended that she push fluids.  I have asked her to drink at least 2 large Gatorade's today and rest.  I want to recheck the patient in 48 hours or sooner if worse.  If she is unable to tolerate oral fluid, she will need IV fluid.  I will also check lab work to rule out hyperthyroidism but checking a TSH particular given her mild goiter on exam.  I will also check a CBC to evaluate for severe anemia along with a CMP to evaluate for any prerenal azotemia.  Recheck in 48 hours or sooner if worse.

## 2017-08-14 ENCOUNTER — Telehealth: Payer: Self-pay | Admitting: Family Medicine

## 2017-08-14 LAB — CBC WITH DIFFERENTIAL/PLATELET
BASOS ABS: 30 {cells}/uL (ref 0–200)
Basophils Relative: 0.2 %
EOS PCT: 0.1 %
Eosinophils Absolute: 15 cells/uL (ref 15–500)
HEMATOCRIT: 38.1 % (ref 35.0–45.0)
Hemoglobin: 12.6 g/dL (ref 11.7–15.5)
LYMPHS ABS: 1575 {cells}/uL (ref 850–3900)
MCH: 27.9 pg (ref 27.0–33.0)
MCHC: 33.1 g/dL (ref 32.0–36.0)
MCV: 84.3 fL (ref 80.0–100.0)
MONOS PCT: 9.1 %
MPV: 10.3 fL (ref 7.5–12.5)
NEUTROS PCT: 80.1 %
Neutro Abs: 12015 cells/uL — ABNORMAL HIGH (ref 1500–7800)
Platelets: 267 10*3/uL (ref 140–400)
RBC: 4.52 10*6/uL (ref 3.80–5.10)
RDW: 13.7 % (ref 11.0–15.0)
TOTAL LYMPHOCYTE: 10.5 %
WBC mixed population: 1365 cells/uL — ABNORMAL HIGH (ref 200–950)
WBC: 15 10*3/uL — ABNORMAL HIGH (ref 3.8–10.8)

## 2017-08-14 LAB — COMPLETE METABOLIC PANEL WITH GFR
AG Ratio: 1.6 (calc) (ref 1.0–2.5)
ALKALINE PHOSPHATASE (APISO): 134 U/L — AB (ref 33–115)
ALT: 15 U/L (ref 6–29)
AST: 13 U/L (ref 10–30)
Albumin: 4.5 g/dL (ref 3.6–5.1)
BILIRUBIN TOTAL: 0.3 mg/dL (ref 0.2–1.2)
BUN: 17 mg/dL (ref 7–25)
CHLORIDE: 102 mmol/L (ref 98–110)
CO2: 23 mmol/L (ref 20–32)
Calcium: 9 mg/dL (ref 8.6–10.2)
Creat: 0.86 mg/dL (ref 0.50–1.10)
GFR, Est African American: 109 mL/min/{1.73_m2} (ref >=60)
GFR, Est Non African American: 94 mL/min/{1.73_m2} (ref >=60)
GLUCOSE: 71 mg/dL (ref 65–99)
Globulin: 2.9 g/dL (calc) (ref 1.9–3.7)
Potassium: 3.8 mmol/L (ref 3.5–5.3)
Sodium: 139 mmol/L (ref 135–146)
Total Protein: 7.4 g/dL (ref 6.1–8.1)

## 2017-08-14 LAB — TSH: TSH: 0.94 m[IU]/L

## 2017-08-14 NOTE — Telephone Encounter (Signed)
-----   Message from Birdie HopesSusan S Bullins sent at 08/14/2017  8:18 AM EDT ----- Patient called at around 440 yesterday, she states she was running fever of 105, and wanted advice as to what she should do

## 2017-08-14 NOTE — Telephone Encounter (Signed)
Called and spoke to pt at 8:45 and pt's fever has come down to 100.3 this am. She is still feeling bad and now her other ear hurts. She did get the antibx yesterday and took 2 doses yesterday and one this am. Informed pt to continue to rotate tylenol and motrin. She was only able to get half of a Gatorade down yesterday as well. She changed flavors as the one she had was not setting well on her stomach and has drank 1/2 of that this am. Informed her that if she gets worse or her fever shoots back up to call us as she will probably need IV fluids as per Dr. Caren MacadamPickard's LOV note. Pt verbalized understanding.

## 2017-08-15 NOTE — Telephone Encounter (Signed)
Labs show elevated wbc consistent with her infection.  Thyroid tests and other labs are normal. If not better, probably needs IV fluids and NTBS.

## 2017-08-15 NOTE — Telephone Encounter (Signed)
Pt aware.

## 2017-08-16 ENCOUNTER — Telehealth: Payer: Self-pay | Admitting: Family Medicine

## 2017-08-16 NOTE — Telephone Encounter (Signed)
Called pt - fever gone - she is back at work and can not come in Monday d/t her work but will call us to give update.

## 2017-08-16 NOTE — Telephone Encounter (Signed)
Pt called to give update and states that her fever is gone but she is still sweaty, nasal congestion, dizzy, hearing decreased in rt ear and some in left, not feeling a lot better but some.

## 2017-08-16 NOTE — Telephone Encounter (Signed)
If fever has subsided and she is some better, I would allow abx more time to work.  Recheck here on Monday at ov.

## 2017-08-21 MED FILL — NORETHINDRONE 0.35 MG TAB: 0.35 | 84 days supply | Qty: 84 | Fill #1

## 2017-09-04 ENCOUNTER — Encounter: Payer: Self-pay | Admitting: Family Medicine

## 2017-11-11 MED FILL — NORETHINDRONE 0.35 MG TAB: 0.35 | 84 days supply | Qty: 84 | Fill #2

## 2017-11-28 ENCOUNTER — Other Ambulatory Visit: Payer: Self-pay

## 2017-11-28 ENCOUNTER — Ambulatory Visit (INDEPENDENT_AMBULATORY_CARE_PROVIDER_SITE_OTHER): Payer: No Typology Code available for payment source | Admitting: Family Medicine

## 2017-11-28 ENCOUNTER — Encounter: Payer: Self-pay | Admitting: Family Medicine

## 2017-11-28 VITALS — BP 96/64 | HR 92 | Temp 98.3°F | Resp 14 | Ht 62.0 in | Wt 185.0 lb

## 2017-11-28 DIAGNOSIS — N3000 Acute cystitis without hematuria: Secondary | ICD-10-CM

## 2017-11-28 LAB — URINALYSIS, ROUTINE W REFLEX MICROSCOPIC
Bilirubin Urine: NEGATIVE
GLUCOSE, UA: NEGATIVE
Hgb urine dipstick: NEGATIVE
KETONES UR: NEGATIVE
Nitrite: NEGATIVE
PROTEIN: NEGATIVE
SPECIFIC GRAVITY, URINE: 1.025 (ref 1.001–1.03)
pH: 6 (ref 5.0–8.0)

## 2017-11-28 LAB — PREGNANCY, URINE: PREG TEST UR: NEGATIVE

## 2017-11-28 LAB — MICROSCOPIC MESSAGE

## 2017-11-28 MED ORDER — ONDANSETRON HCL 4 MG PO TABS: 4.0000 mg | ORAL_TABLET | Freq: Three times a day (TID) | ORAL | 0 refills | Status: DC | PRN

## 2017-11-28 MED ORDER — CEPHALEXIN 500 MG PO CAPS: 500.0000 mg | ORAL_CAPSULE | Freq: Four times a day (QID) | ORAL | 0 refills | Status: AC

## 2017-11-28 MED FILL — CEPHALEXIN 500 MG CAPSULE: 500 | 5 days supply | Qty: 20 | Fill #0

## 2017-11-28 MED FILL — ONDANSETRON HCL 4 MG TABLET: 4 | 4 days supply | Qty: 10 | Fill #0

## 2017-11-28 NOTE — Progress Notes (Signed)
Patient ID: Rebecca Schroeder, female    DOB: 07-Sep-1990, 27 y.o.   MRN: [id number]  PCP: Salley Scarlet, MD  Chief Complaint  Patient presents with  . Dysuria    x10 days- frequency, urgency, pressure    Subjective:   Rebecca Schroeder is a 27 y.o. female, presents to clinic with CC of dysuria, urinary frequency and urgency, gradually worsening for 1.5 weeks.  She has developed fairly constant, mild to moderate suprapubic pressure and sensation of bladder fullness.  She denies N, V, D, fever, flank pain.  She has recently stopped nursing, is having irregular periods, is inconsistantly taking POP.  Sexually active with husband.  Negative home pregnancy test 2 days ago.   Had UTI one year ago with secondary yeast vaginitis.  She has no hx of recurrent or complicated UTI's, pyelo.     Patient Active Problem List   Diagnosis Date Noted  . H/O cesarean section 04/10/2017  . Chronic insomnia 07/25/2016  . Depression with anxiety 05/11/2015  . Subcutaneous nodules 05/11/2015     Prior to Admission medications   Medication Sig Start Date End Date Taking? Authorizing Provider  norethindrone (ORTHO MICRONOR) 0.35 MG tablet Take 1 tablet (0.35 mg total) by mouth daily. 06/19/17  Yes Vansant, Velna Hatchet, MD     Allergies  Allergen Reactions  . Penicillins Other (See Comments) and Rash    Unknown Has patient had a PCN reaction causing immediate rash, facial/tongue/throat swelling, SOB or lightheadedness with hypotension: Unknown Has patient had a PCN reaction causing severe rash involving mucus membranes or skin necrosis: Unknown Has patient had a PCN reaction that required hospitalization: Unknown Has patient had a PCN reaction occurring within the last 10 years: Unknown If all of the above answers are "NO", then may proceed with Cephalosporin use. Childhood reaction.  . Codeine Nausea And Vomiting  . Hydrocodone-Acetaminophen Nausea And Vomiting  . Roxicodone [Oxycodone Hcl]  Nausea And Vomiting and Other (See Comments)    SEVERE VOMITING----Patient DOES NOT tolerate with anti nausea medications     Family History  Problem Relation Age of Onset  . Depression Mother   . Thyroid disease Mother   . Alcohol abuse Father   . Hypertension Maternal Grandfather   . Diabetes Maternal Grandfather   . Prostate cancer Maternal Grandfather      Social History   Socioeconomic History  . Marital status: Single    Spouse name: Not on file  . Number of children: Not on file  . Years of education: Not on file  . Highest education level: Not on file  Occupational History  . Not on file  Social Needs  . Financial resource strain: Not on file  . Food insecurity:    Worry: Not on file    Inability: Not on file  . Transportation needs:    Medical: Not on file    Non-medical: Not on file  Tobacco Use  . Smoking status: Never Smoker  . Smokeless tobacco: Never Used  Substance and Sexual Activity  . Alcohol use: Yes    Alcohol/week: 0.0 standard drinks    Comment: occasionally  . Drug use: No  . Sexual activity: Yes    Birth control/protection: IUD  Lifestyle  . Physical activity:    Days per week: Not on file    Minutes per session: Not on file  . Stress: Not on file  Relationships  . Social connections:    Talks on phone: Not on  file    Gets together: Not on file    Attends religious service: Not on file    Active member of club or organization: Not on file    Attends meetings of clubs or organizations: Not on file    Relationship status: Not on file  . Intimate partner violence:    Fear of current or ex partner: Not on file    Emotionally abused: Not on file    Physically abused: Not on file    Forced sexual activity: Not on file  Other Topics Concern  . Not on file  Social History Narrative  . Not on file     Review of Systems  Constitutional: Negative.  Negative for activity change, appetite change, chills, diaphoresis, fatigue, fever and  unexpected weight change.  HENT: Negative.   Eyes: Negative.   Respiratory: Negative.   Cardiovascular: Negative.   Gastrointestinal: Negative.   Endocrine: Negative.   Genitourinary: Negative.  Negative for flank pain, genital sores, hematuria, vaginal bleeding, vaginal discharge and vaginal pain.  Musculoskeletal: Negative.   Skin: Negative.   Allergic/Immunologic: Negative.   Neurological: Negative.   Hematological: Negative.   Psychiatric/Behavioral: Negative.   All other systems reviewed and are negative.      Objective:    Vitals:   11/28/17 1529  BP: 96/64  Pulse: 92  Resp: 14  Temp: 98.3 F (36.8 C)  TempSrc: Oral  SpO2: 97%  Weight: 185 lb (83.9 kg)  Height: 5\' 2"  (1.575 m)      Physical Exam  Constitutional: She appears well-developed and well-nourished. No distress.  HENT:  Head: Normocephalic and atraumatic.  Nose: Nose normal.  Eyes: Conjunctivae are normal. Right eye exhibits no discharge. Left eye exhibits no discharge.  Neck: Normal range of motion. No tracheal deviation present.  Cardiovascular: Normal rate, regular rhythm, normal heart sounds and intact distal pulses. Exam reveals no gallop and no friction rub.  No murmur heard. Pulmonary/Chest: Effort normal and breath sounds normal. No stridor. No respiratory distress. She has no wheezes. She has no rales. She exhibits no tenderness.  Abdominal: Soft. Bowel sounds are normal. She exhibits no distension and no mass. There is tenderness. There is no rebound and no guarding.  Very mild ttp to suprapubic area w/o guarding or rebound tenderness, no CVA tenderness b/l  Musculoskeletal: Normal range of motion.  Neurological: She is alert. She exhibits normal muscle tone. Coordination normal.  Skin: Skin is warm and dry. No rash noted. She is not diaphoretic.  Psychiatric: She has a normal mood and affect. Her behavior is normal.  Nursing note and vitals reviewed.    UA + for trace leuks, microscopy +  for many bacteria Pending POC urine preg     Assessment & Plan:      ICD-10-CM   1. Acute cystitis without hematuria N30.00 Urinalysis, Routine w reflex microscopic    cephALEXin (KEFLEX) 500 MG capsule    ondansetron (ZOFRAN) 4 MG tablet    Pregnancy, urine    Will add urine culture if preg +, pt does not suspect, but periods are regular, and she inconsistently takes her oral birth control pills.  Patient will contact us if she develops any secondary yeast vaginitis   Danelle Berry, PA-C 11/28/17 3:43 PM

## 2017-12-06 ENCOUNTER — Encounter: Payer: Self-pay | Admitting: Family Medicine

## 2017-12-06 MED ORDER — NITROFURANTOIN MONOHYD MACRO 100 MG PO CAPS: 100.0000 mg | ORAL_CAPSULE | Freq: Two times a day (BID) | ORAL | 0 refills | Status: AC

## 2017-12-06 MED FILL — NITROFURANTOIN MONO-MCR 100: 100 | 5 days supply | Qty: 10 | Fill #0

## 2017-12-09 MED ORDER — ETONOGESTREL-ETHINYL ESTRADIOL 0.12-0.015 MG/24HR VA RING: VAGINAL_RING | VAGINAL | 12 refills | Status: DC

## 2017-12-09 MED FILL — NUVARING VAGINAL RING: 0.12-0.015 | 84 days supply | Qty: 3 | Fill #0

## 2017-12-25 ENCOUNTER — Ambulatory Visit (INDEPENDENT_AMBULATORY_CARE_PROVIDER_SITE_OTHER): Payer: No Typology Code available for payment source | Admitting: Family Medicine

## 2017-12-25 ENCOUNTER — Encounter: Payer: Self-pay | Admitting: Family Medicine

## 2017-12-25 ENCOUNTER — Other Ambulatory Visit: Payer: Self-pay

## 2017-12-25 VITALS — BP 118/60 | HR 66 | Temp 98.1°F | Resp 14 | Ht 62.0 in | Wt 187.0 lb

## 2017-12-25 DIAGNOSIS — F32A Depression, unspecified: Secondary | ICD-10-CM

## 2017-12-25 DIAGNOSIS — R5383 Other fatigue: Secondary | ICD-10-CM | POA: Diagnosis not present

## 2017-12-25 DIAGNOSIS — F418 Other specified anxiety disorders: Secondary | ICD-10-CM

## 2017-12-25 DIAGNOSIS — F329 Major depressive disorder, single episode, unspecified: Secondary | ICD-10-CM | POA: Diagnosis not present

## 2017-12-25 MED ORDER — BUPROPION HCL ER (XL) 150 MG PO TB24: 150.0000 mg | ORAL_TABLET | Freq: Every day | ORAL | 3 refills | Status: DC

## 2017-12-25 MED FILL — buPROPion HCL ER (XL) 150 M: 150 | 30 days supply | Qty: 30 | Fill #0

## 2017-12-25 NOTE — Progress Notes (Signed)
   Subjective:    Patient ID: Rebecca BrightlyCourtney M Halbig, female    DOB: Apr 20, 1990, 27 y.o.   MRN: [id number]  Patient presents for Fatigue (states that she is having issues with staying asleep- increased irritability and loss of focus)  Here with increased fatigue start now being 8 months in the postpartum state.  States that she is tired all the day even when she sleeps well throughout the night.  She is in school getting her masters degree and works full-time.  But she feels like her mood has been affected.  She is irritable she gets depressed she cries "at the drop of a hat"  She feels like her anxiety gets worse because she is so tired as well.  She has been treated for depression and anxiety in the past she was on Wellbutrin at one point but had some anorexia-like symptoms but she now clearly states that is more the stress with her boyfriend that caused her not to eat not the medication.  She was then transition to Effexor but had weird headaches with that.  She is next to start Lexapro but then found that she was pregnant so never took anything.  She has not been on any medications for about 2 years now.  She is also stressed over not being able to lose weight.  She did have her thyroid checked about 3 months ago and that was normal in the postpartum state She does walk 30 minutes a day for exercise She is still doing well at work  Review Of Systems:  GEN-+fatigue, fever, weight loss,weakness, recent illness HEENT- denies eye drainage, change in vision, nasal discharge, CVS- denies chest pain, palpitations RESP- denies SOB, cough, wheeze ABD- denies N/V, change in stools, abd pain GU- denies dysuria, hematuria, dribbling, incontinence MSK- denies joint pain, muscle aches, injury Neuro- denies headache, dizziness, syncope, seizure activity       Objective:    BP 118/60   Pulse 66   Temp 98.1 F (36.7 C) (Oral)   Resp 14   Ht 5\' 2"  (1.575 m)   Wt 187 lb (84.8 kg)   SpO2 97%   BMI 34.20  kg/m  GEN- NAD, alert and oriented x3 HEENT- PERRL, EOMI, non injected sclera, pink conjunctiva, MMM, oropharynx clear Neck- Supple, no thyromegaly CVS- RRR, no murmur RESP-CTAB Psych- normal affect and mood, good eye contact, no SI  Audit C nEG PHQ9-12 GAD7- 8      Assessment & Plan:      Problem List Items Addressed This Visit      Unprioritized   Depression with anxiety - Primary    After discussion and with some of the side effects with other meds, she wants to try the wellbutrin again. I did discuss stimulants with her, but she does not do well with stimulants. Start with 150mg  XL  F/U in 4 weeks She is to call for any side effects or concerns Continue her walking for exercise  I think her fatigue is due the mood changes, has had negative labs/TSH      Relevant Medications   buPROPion (WELLBUTRIN XL) 150 MG 24 hr tablet    Other Visit Diagnoses    Fatigue due to depression          Note: This dictation was prepared with Dragon dictation along with smaller phrase technology. Any transcriptional errors that result from this process are unintentional.

## 2017-12-25 NOTE — Patient Instructions (Signed)
FU 1 MONTH  

## 2017-12-26 ENCOUNTER — Encounter: Payer: Self-pay | Admitting: Family Medicine

## 2017-12-26 NOTE — Assessment & Plan Note (Addendum)
After discussion and with some of the side effects with other meds, she wants to try the wellbutrin again. I did discuss stimulants with her, but she does not do well with stimulants. Start with 150mg  XL  F/U in 4 weeks She is to call for any side effects or concerns Continue her walking for exercise  I think her fatigue is due the mood changes, has had negative labs/TSH

## 2018-01-27 ENCOUNTER — Ambulatory Visit (INDEPENDENT_AMBULATORY_CARE_PROVIDER_SITE_OTHER): Payer: No Typology Code available for payment source | Admitting: Family Medicine

## 2018-01-27 ENCOUNTER — Encounter: Payer: Self-pay | Admitting: Family Medicine

## 2018-01-27 ENCOUNTER — Other Ambulatory Visit: Payer: Self-pay

## 2018-01-27 VITALS — BP 110/74 | HR 88 | Temp 98.1°F | Resp 16 | Ht 62.0 in | Wt 181.0 lb

## 2018-01-27 DIAGNOSIS — F418 Other specified anxiety disorders: Secondary | ICD-10-CM

## 2018-01-27 MED ORDER — BUPROPION HCL ER (XL) 300 MG PO TB24: 300.0000 mg | ORAL_TABLET | Freq: Every day | ORAL | 6 refills | Status: DC

## 2018-01-27 MED FILL — buPROPion HCL ER (XL) 300 M: 300 | 30 days supply | Qty: 30 | Fill #0

## 2018-01-27 NOTE — Patient Instructions (Signed)
wellbutrin 300mg  once a day  email me in 2-3 weeks see how medication is doing F/U 3 months

## 2018-01-27 NOTE — Assessment & Plan Note (Signed)
After discussion about her medications we will try increasing her Wellbutrin to 300 mg extended release once a day and see how she does.  She is getting email me in about 2 to 3 weeks to see how her symptoms are progressing.  We are still avoiding stimulant medications with regards to her fatigue based on her family history dealing with stimulant medications and she does not even tolerate caffeine very well.
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
## 2018-02-11 ENCOUNTER — Encounter: Payer: Self-pay | Admitting: Family Medicine

## 2018-02-12 MED ORDER — ESCITALOPRAM OXALATE 5 MG PO TABS: 5.0000 mg | ORAL_TABLET | Freq: Every day | ORAL | 3 refills | Status: DC

## 2018-02-12 MED FILL — ESCITALOPRAM 5 MG TABLET: 5 | 30 days supply | Qty: 30 | Fill #0

## 2018-02-19 ENCOUNTER — Encounter: Payer: Self-pay | Admitting: Family Medicine

## 2018-02-27 ENCOUNTER — Encounter: Payer: Self-pay | Admitting: Family Medicine

## 2018-02-27 DIAGNOSIS — F329 Major depressive disorder, single episode, unspecified: Secondary | ICD-10-CM

## 2018-02-27 DIAGNOSIS — F418 Other specified anxiety disorders: Secondary | ICD-10-CM

## 2018-02-27 DIAGNOSIS — R5383 Other fatigue: Secondary | ICD-10-CM

## 2018-02-28 MED ORDER — HYDROXYZINE HCL 10 MG PO TABS: 10.0000 mg | ORAL_TABLET | Freq: Three times a day (TID) | ORAL | 0 refills | Status: DC | PRN

## 2018-02-28 MED FILL — hydrOXYzine HCL 10 MG TABS: 10 | 10 days supply | Qty: 30 | Fill #0

## 2018-02-28 MED FILL — ETONOGESTREL-ETHINYL ESTRAD: 0.12-0.015 | 84 days supply | Qty: 3 | Fill #1

## 2018-03-07 ENCOUNTER — Telehealth: Payer: Self-pay | Admitting: Family Medicine

## 2018-03-07 MED FILL — buPROPion HCL ER (XL) 300 M: 300 | 30 days supply | Qty: 30 | Fill #1

## 2018-03-07 NOTE — Telephone Encounter (Signed)
error 

## 2018-03-07 NOTE — Addendum Note (Signed)
Addended by: Phillips Odor on: 03/07/2018 09:51 AM   Modules accepted: Level of Service, SmartSet

## 2018-03-07 NOTE — Telephone Encounter (Signed)
This encounter was created in error - please disregard.

## 2018-03-17 ENCOUNTER — Encounter: Payer: Self-pay | Admitting: Family Medicine

## 2018-04-01 ENCOUNTER — Encounter: Payer: Self-pay | Admitting: Family Medicine

## 2018-04-01 MED ORDER — HYDROXYZINE HCL 10 MG PO TABS: 10.0000 mg | ORAL_TABLET | Freq: Three times a day (TID) | ORAL | 0 refills | Status: DC | PRN

## 2018-04-17 MED FILL — hydrOXYzine HCL 10 MG TABS: 10 | 10 days supply | Qty: 30 | Fill #0

## 2018-04-17 MED FILL — buPROPion HCL ER (XL) 300 M: 300 | 30 days supply | Qty: 30 | Fill #2

## 2018-04-23 ENCOUNTER — Other Ambulatory Visit: Payer: Self-pay

## 2018-04-23 ENCOUNTER — Encounter: Payer: Self-pay | Admitting: Family Medicine

## 2018-04-23 ENCOUNTER — Ambulatory Visit (INDEPENDENT_AMBULATORY_CARE_PROVIDER_SITE_OTHER): Payer: No Typology Code available for payment source | Admitting: Family Medicine

## 2018-04-23 VITALS — BP 136/74 | HR 93 | Temp 98.2°F | Resp 14 | Ht 62.0 in | Wt 178.0 lb

## 2018-04-23 DIAGNOSIS — R319 Hematuria, unspecified: Secondary | ICD-10-CM | POA: Diagnosis not present

## 2018-04-23 DIAGNOSIS — F418 Other specified anxiety disorders: Secondary | ICD-10-CM

## 2018-04-23 DIAGNOSIS — N390 Urinary tract infection, site not specified: Secondary | ICD-10-CM

## 2018-04-23 DIAGNOSIS — J302 Other seasonal allergic rhinitis: Secondary | ICD-10-CM | POA: Diagnosis not present

## 2018-04-23 LAB — URINALYSIS, ROUTINE W REFLEX MICROSCOPIC
BILIRUBIN URINE: NEGATIVE
GLUCOSE, UA: NEGATIVE
HYALINE CAST: NONE SEEN /LPF
KETONES UR: NEGATIVE
Nitrite: POSITIVE — AB
PH: 5.5 (ref 5.0–8.0)
Specific Gravity, Urine: 1.025 (ref 1.001–1.03)

## 2018-04-23 LAB — MICROSCOPIC MESSAGE

## 2018-04-23 MED ORDER — CEPHALEXIN 500 MG PO CAPS: 500.0000 mg | ORAL_CAPSULE | Freq: Four times a day (QID) | ORAL | 0 refills | Status: DC

## 2018-04-23 MED ORDER — FLUCONAZOLE 150 MG PO TABS: ORAL_TABLET | ORAL | 0 refills | Status: DC

## 2018-04-23 MED FILL — FLUCONAZOLE 150 MG TABS: 150 | 3 days supply | Qty: 2 | Fill #0

## 2018-04-23 MED FILL — CEPHALEXIN 500 MG CAPSULE: 500 | 5 days supply | Qty: 20 | Fill #0

## 2018-04-23 NOTE — Progress Notes (Signed)
   Subjective:    Patient ID: Rebecca Schroeder, female    DOB: Jul 31, 1990, 28 y.o.   MRN: [id number]  Patient presents for Pressure / pain in bladder and after urinating      GAD- her grandfather  passed away at, had SE with lexapro, using wellbutrin still she could tell a difference when she came off of it.  She is also using hydroxyzine as needed for sleep and anxiety.  She has not heard back from [facility] behavioral health.  She is still interested in pursuing psychotherapy and medication adjustments   UTI symptoms for past 2 days, also about to start menses.  Has had burning with pressure lower abdominal pain   Ear pain and mild sore itchy throat, no recent fever is more for allergies does have antihistamine.  No cough     Review Of Systems:  GEN- denies fatigue, fever, weight loss,weakness, recent illness HEENT- denies eye drainage, change in vision, nasal discharge, CVS- denies chest pain, palpitations RESP- denies SOB, cough, wheeze ABD- denies N/V, change in stools, +abd pain GU-+dysuria, hematuria, dribbling, incontinence MSK- denies joint pain, muscle aches, injury Neuro- denies headache, dizziness, syncope, seizure activity       Objective:    BP 136/74   Pulse 93   Temp 98.2 F (36.8 C) (Oral)   Resp 14   Ht 5\' 2"  (1.575 m)   Wt 178 lb (80.7 kg)   SpO2 98%   BMI 32.56 kg/m  GEN- NAD, alert and oriented x3 HEENT- PERRL, EOMI, non injected sclera, pink conjunctiva, MMM, oropharynx clear, nares clear TMs clear no effusion no maxillary sinus tenderness Neck- Supple, no thyromegaly, no lymphadenopathy CVS- RRR, no murmur RESP-CTAB ABD-NABS,soft, tender to palpation in suprapubic region ND, no CVA tenderness Psych-normal affect and mood Pulses- Radial2+        Assessment & Plan:      Problem List Items Addressed This Visit      Unprioritized   Depression with anxiety    Continue Wellbutrin and hydroxyzine.  I have given her numbers directly for [facility]  behavioral as well as a backup will try it psychiatric she will call and see if appointment can be scheduled.       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 Any transcriptional errors that result from this process are unintentional.

## 2018-04-23 NOTE — Patient Instructions (Signed)
[phone]  [facility] [phone] Take keflex Diflucan for yeast infection if needed F/U as needed

## 2018-04-23 NOTE — Assessment & Plan Note (Signed)
Continue Wellbutrin and hydroxyzine.  I have given her numbers directly for [facility] as well as a backup will try it psychiatric she will call and see if appointment can be scheduled.

## 2018-05-02 MED FILL — ETONOGESTREL-ETHINYL ESTRAD: 0.12-0.015 | 84 days supply | Qty: 3 | Fill #0

## 2018-05-06 ENCOUNTER — Encounter: Payer: Self-pay | Admitting: Family Medicine

## 2018-05-06 ENCOUNTER — Other Ambulatory Visit: Payer: Self-pay

## 2018-05-06 ENCOUNTER — Ambulatory Visit (INDEPENDENT_AMBULATORY_CARE_PROVIDER_SITE_OTHER): Payer: No Typology Code available for payment source | Admitting: Family Medicine

## 2018-05-06 ENCOUNTER — Telehealth: Payer: Self-pay | Admitting: *Deleted

## 2018-05-06 DIAGNOSIS — R0789 Other chest pain: Secondary | ICD-10-CM | POA: Diagnosis not present

## 2018-05-06 DIAGNOSIS — Z20828 Contact with and (suspected) exposure to other viral communicable diseases: Secondary | ICD-10-CM | POA: Diagnosis not present

## 2018-05-06 DIAGNOSIS — Z20822 Contact with and (suspected) exposure to covid-19: Secondary | ICD-10-CM

## 2018-05-06 DIAGNOSIS — J029 Acute pharyngitis, unspecified: Secondary | ICD-10-CM | POA: Diagnosis not present

## 2018-05-06 NOTE — Telephone Encounter (Signed)
Received call from patient.   States that she has x1 day of chest tightness and scratchy throat. No fever, cough. States that there is no chest pain, only a sense that she can't take a deep breath.   Reports that patient son is currently sick with fever, cough, and rash.   Per PCP, patient will need to go to ER if chest pain noted. If not, use televisit with another provider as child is sick.   Patient made aware and agreeable to plan. Appointment scheduled with PA. Agreeable to televisit.

## 2018-05-06 NOTE — Progress Notes (Signed)
Virtual Visit via Telephone Note  I connected with Kieth Brightly on 05/06/18 at 11:33 by telephone and verified that I am speaking with the correct person using two identifiers.    Pt Location at home   Physician location: at home, [facility] MD  I discussed the limitations, risks, security and privacy concerns of performing an evaluation and management service by telephone and the availability of in person appointments. I also discussed with the patient that there may be a patient responsible charge related to this service. The patient expressed understanding and agreed to proceed.   History of Present Illness:  Her 67 year old son has been sick with  cold with congestion, fever that started last Friday he was seen by pediatrician, neg flu test, her oldest son came down with similar symptoms as well.   This morning, has itchy throat, and feels tightnes in center of chest, when she breathes feels like there is pressure but also hurts when she just presses it or reaches for something, has been more fatigued since the kids have been sick, no fever  No significant cough yet. No sinus drainage, has mild headache  She was working as a Engineer, civil (consulting) at the [employer] Denies any GI symptoms Has not taken any meds yet      Observations/Objective: No obvious difficulty breathing over the phone Speaking in normal sentences  Assessment and Plan:  Sore throat/ chest pressure-likely early viral symptoms since children have been sick, though she does have risk for Covid-19 since she was screening and testing patients. Her chest pressure is reproducible so it does not sound cardiac. I think this will be a tincture of time and will have to see how she declares her self over th enext 24 hours, discussed red flags, fever, SOB, Cough to go in for healthcare exposure testing for Covid Otherwise stay within home , try ibuprofen or tylenol, zyrtec for itchy throat, salt water gargle, if cough  develops she has OTC cough meds   Recommend she call health at work as well to report symptoms and stay quarantined   Follow Up Instructions:    I discussed the assessment and treatment plan with the patient. The patient was provided an opportunity to ask questions and all were answered. The patient agreed with the plan and demonstrated an understanding of the instructions.   The patient was advised to call back or seek an in-person evaluation if the symptoms worsen or if the condition fails to improve as anticipated.  I provided 12 minutes of non-face-to-face time during this encounter. 11:45pm  Milinda Antis, MD End time

## 2018-05-13 ENCOUNTER — Encounter: Payer: Self-pay | Admitting: Family Medicine

## 2018-05-22 ENCOUNTER — Other Ambulatory Visit: Payer: Self-pay | Admitting: *Deleted

## 2018-05-22 ENCOUNTER — Encounter: Payer: Self-pay | Admitting: Family Medicine

## 2018-05-22 DIAGNOSIS — Z87440 Personal history of urinary (tract) infections: Secondary | ICD-10-CM

## 2018-05-30 ENCOUNTER — Other Ambulatory Visit: Payer: Self-pay

## 2018-05-30 ENCOUNTER — Other Ambulatory Visit: Payer: No Typology Code available for payment source

## 2018-05-30 ENCOUNTER — Encounter: Payer: Self-pay | Admitting: Family Medicine

## 2018-05-30 DIAGNOSIS — Z87440 Personal history of urinary (tract) infections: Secondary | ICD-10-CM

## 2018-05-30 LAB — URINALYSIS, ROUTINE W REFLEX MICROSCOPIC
Bilirubin Urine: NEGATIVE
Glucose, UA: NEGATIVE
Hgb urine dipstick: NEGATIVE
Ketones, ur: NEGATIVE
Leukocytes,Ua: NEGATIVE
Nitrite: NEGATIVE
Protein, ur: NEGATIVE
Specific Gravity, Urine: 1.027 (ref 1.001–1.03)
pH: 6 (ref 5.0–8.0)

## 2018-05-31 LAB — URINE CULTURE
MICRO NUMBER:: 420074
SPECIMEN QUALITY:: ADEQUATE

## 2018-06-02 ENCOUNTER — Ambulatory Visit (INDEPENDENT_AMBULATORY_CARE_PROVIDER_SITE_OTHER): Payer: Medicaid Other | Admitting: Psychiatry

## 2018-06-02 ENCOUNTER — Encounter (HOSPITAL_COMMUNITY): Payer: Self-pay | Admitting: Psychiatry

## 2018-06-02 ENCOUNTER — Other Ambulatory Visit: Payer: Self-pay

## 2018-06-02 DIAGNOSIS — F321 Major depressive disorder, single episode, moderate: Secondary | ICD-10-CM

## 2018-06-02 MED ORDER — FLUOXETINE HCL 20 MG PO CAPS: 20.0000 mg | ORAL_CAPSULE | Freq: Every day | ORAL | 0 refills | Status: DC

## 2018-06-02 MED FILL — FLUoxetine HCL 20 MG CAPS: 20 | 30 days supply | Qty: 30 | Fill #0

## 2018-06-02 NOTE — Progress Notes (Signed)
Psychiatric Initial Adult Assessment  (Assessment done via phone/WebEx) Patient Identification: Rebecca Schroeder MRN:  [id number] Date of Evaluation:  06/02/2018 Referral Source: self  Chief Complaint: depression   Visit Diagnosis:    ICD-10-CM   1. Current moderate episode of major depressive disorder without prior episode (HCC) F32.1 FLUoxetine (PROZAC) 20 MG capsule    History of Present Illness:  28 year old female, lives with fianc and two children ages 61 and 72-year-old, employed.  Patient reports history of depression.  States she has been depressed for several years "on and off", with waxing and waning symptoms over time.  She had seen her primary care doctor, who had started on Wellbutrin XL initially 150 mg daily, then up to 300 mg daily and added Lexapro 10 mg daily.  Patient states she took the Wellbutrin for about a month and the Lexapro for about 1 to 2 weeks, but discontinued these because of headache.  She has been off psychiatric medications for about 1 month now.  She reports neurovegetative symptoms as below.  Of note she states that she continues to function well in her daily activities including at home, with her children, and at work, but that "inside I feel depressed", and struggles with a subjective sense of poor self-esteem.  She denies any suicidal ideations or self-injurious thoughts and identifies love for her children and fianc is a protective factor from ever considering suicide.  Denies psychotic symptoms. She does not identify any specific stressors or triggers for depression, but states that her fianc has a history of substance abuse although he is now in sustained remission for several years.  States that she does, however, worry about him possibly relapsing in the future  Associated Signs/Symptoms: Depression Symptoms:  depressed mood, anhedonia, insomnia, difficulty concentrating, recurrent thoughts of death, loss of energy/fatigue, increased  appetite, (Hypo) Manic Symptoms: None noted or endorsed.  Does state that she has felt vaguely irritable when depressed Anxiety Symptoms: Describes some increased subjective feelings of anxiety, describes a prior history of panic attacks which have tended to improve/abate over time Psychotic Symptoms: Denies PTSD Symptoms: Does not endorse  Past Psychiatric History: No history of prior psychiatric admissions.  No history of suicidal attempts or of self cutting/self-injurious behaviors.  No history of psychosis, no history of mania or hypomania, no history of PTSD.  Describes a history of depression which she states started a few years ago and has been intermittent in presentation.  Describes history of panic attacks which have improved over time.  Currently does not endorse agoraphobia.   Previous Psychotropic Medications: As above, reports she was recently on Wellbutrin XL/Lexapro combination.  Stopped it due to headache.  Has been off psychiatric medications for about 4 to 5 weeks.  She also remembers having been on Effexor XR in the past which she also thinks caused headache.   Substance Abuse History in the last 12 months: Denies alcohol or drug abuse  Consequences of Substance Abuse: Denies  Past Medical History: Does not endorse medical illnesses.  Allergic to penicillin and intolerant to hydrocodone.  Denies pregnancy.  She is not currently taking  medications other than a contraceptive medication. Past Medical History:  Diagnosis Date  . 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 Back pain    occasional that radiates down leg    Past Surgical History:  Procedure Laterality Date  . CESAREAN SECTION    . CESAREAN SECTION N/A 04/10/2017   Procedure: REPEAT CESAREAN SECTION;  Surgeon: Harold Hedge,  MD;  Location: [facility];  Service: Obstetrics;  Laterality: N/A;  Repeat edc 04/17/17 allergy to codeine, hydrocodone, PCN need RNFA.Marland Kitchen.Marland Kitchen.Marland Kitchen.Personnel officerHeather Krietemeyer RNFA  . TOE SURGERY    . WISDOM  TOOTH EXTRACTION      Family Psychiatric History: Reports mother has history of anxiety, father has history of alcohol abuse.  No suicides in family.  Family History: Parents are alive, separated, has one younger half brother Family History  Problem Relation Age of Onset  . Depression Mother   . Thyroid disease Mother   . Alcohol abuse Father   . Hypertension Maternal Grandfather   . Diabetes Maternal Grandfather   . Prostate cancer Maternal Grandfather     Social History: Lives with fianc, whom she reports has a history of substance abuse but is sober for almost 5 years now.  Has 2 children ages 27 years old and 689-year-old,  employed.  Social History   Socioeconomic History  . Marital status: Single    Spouse name: Not on file  . Number of children: Not on file  . Years of education: Not on file  . Highest education level: Not on file  Occupational History  . Not on file  Social Needs  . Financial resource strain: Not on file  . Food insecurity:    Worry: Not on file    Inability: Not on file  . Transportation needs:    Medical: Not on file    Non-medical: Not on file  Tobacco Use  . Smoking status: Never Smoker  . Smokeless tobacco: Never Used  Substance and Sexual Activity  . Alcohol use: Yes    Alcohol/week: 0.0 standard drinks    Comment: occasionally  . Drug use: No  . Sexual activity: Yes    Birth control/protection: I.U.D.  Lifestyle  . Physical activity:    Days per week: Not on file    Minutes per session: Not on file  . Stress: Not on file  Relationships  . Social connections:    Talks on phone: Not on file    Gets together: Not on file    Attends religious service: Not on file    Active member of club or organization: Not on file    Attends meetings of clubs or organizations: Not on file    Relationship status: Not on file  Other Topics Concern  . Not on file  Social History Narrative  . Not on file    Additional Social History:    Allergies:   Allergies  Allergen Reactions  . Penicillins Other (See Comments) and Rash    Unknown Has patient had a PCN reaction causing immediate rash, facial/tongue/throat swelling, SOB or lightheadedness with hypotension: Unknown Has patient had a PCN reaction causing severe rash involving mucus membranes or skin necrosis: Unknown Has patient had a PCN reaction that required hospitalization: Unknown Has patient had a PCN reaction occurring within the last 10 years: Unknown If all of the above answers are "NO", then may proceed with Cephalosporin use. Childhood reaction.  . Codeine Nausea And Vomiting  . Hydrocodone-Acetaminophen Nausea And Vomiting  . Roxicodone [Oxycodone Hcl] Nausea And Vomiting and Other (See Comments)    SEVERE VOMITING----Patient DOES NOT tolerate with anti nausea medications    Metabolic Disorder Labs: No results found for: HGBA1C, MPG No results found for: PROLACTIN Lab Results  Component Value Date   CHOL 159 06/19/2017   TRIG 69 06/19/2017   HDL 50 (L) 06/19/2017   CHOLHDL 3.2 06/19/2017  LDLCALC 93 06/19/2017   Lab Results  Component Value Date   TSH 0.94 08/13/2017    Therapeutic Level Labs: No results found for: LITHIUM No results found for: CBMZ No results found for: VALPROATE  Current Medications: Current Outpatient Medications  Medication Sig Dispense Refill  . etonogestrel-ethinyl estradiol (NUVARING) 0.12-0.015 MG/24HR vaginal ring Insert vaginally and leave in place for 3 consecutive weeks, then remove for 1 week. 3 each 12   No current facility-administered medications for this visit.       Psychiatric Specialty Exam: Please take into account inherent limitations related to phone assessment ROS no chest pain, no shortness of breath, no coughing  not currently breastfeeding.There is no height or weight on file to calculate BMI.  General Appearance: NA  Eye Contact:  NA  Speech:  Normal Rate  Volume:  Normal  Mood:   Reports chronic depression  Affect:  Vaguely constricted although does laugh briefly at times appropriately during session  Thought Process:  Linear and Descriptions of Associations: Intact  Orientation:  Full (Time, Place, and Person)  Thought Content:  No hallucinations, no delusions  Suicidal Thoughts:  No currently denies suicidal ideations, denies any self-injurious ideations, denies violent or homicidal ideations  Homicidal Thoughts:  No  Memory:  Recent and remote grossly intact  Judgement:  Other:  Present  Insight:  Present  Psychomotor Activity:  NA  Concentration:  Concentration: Good and Attention Span: Good  Recall:  Good  Fund of Knowledge:Good  Language: Good  Akathisia:  Negative  Handed:  Right  AIMS (if indicated): NA  Assets:  Communication Skills Desire for Improvement Social Support Vocational/Educational  ADL's:  Intact  Cognition: WNL  Sleep:  Fair   Screenings: GAD-7     Office Visit from 12/25/2017 in [facility]  Total GAD-7 Score  8    PHQ2-9     Office Visit from 12/25/2017 in [facility] Office Visit from 07/25/2016 in [facility] Office Visit from 05/10/2015 in [facility]  PHQ-2 Total Score  3  0  2  PHQ-9 Total Score  12  12  14       Assessment and Plan:  28 year old female, 2 children, lives with fianc and her children.  Employed.  Reports a history of chronic depression which has waxed and waned over time.  Describes neurovegetative symptoms including subjective sense of anhedonia, low energy, decreased concentration, and a subjective sense of low self-esteem.  Denies any suicidal or self-injurious ideations.  Denies any psychotic symptoms.  She was recently tried on Wellbutrin XL and Lexapro by her PCP but stopped after a few weeks due to headache.  She is now off psychiatric medications for about 5 weeks.  She is interested in starting another antidepressant trial.  We  discussed options, and reviewed different antidepressant medication options.  She agrees to Prozac trial.  Side effects discussed as well as therapeutic lag associated with antidepressant effect.  Diagnoses- MDD Moderate Start Prozac at 20 mg daily. Will send via e script to her pharmacy. We will see patient in 3 to 4 weeks.  She agrees to contact clinic sooner should to be any worsening or concern prior.      Craige Cotta, MD 4/27/20203:39 PM

## 2018-07-08 ENCOUNTER — Other Ambulatory Visit: Payer: Self-pay

## 2018-07-08 ENCOUNTER — Encounter (HOSPITAL_COMMUNITY): Payer: Self-pay | Admitting: Psychiatry

## 2018-07-08 ENCOUNTER — Other Ambulatory Visit (HOSPITAL_COMMUNITY): Payer: Self-pay

## 2018-07-08 ENCOUNTER — Ambulatory Visit (INDEPENDENT_AMBULATORY_CARE_PROVIDER_SITE_OTHER): Payer: Medicaid Other | Admitting: Psychiatry

## 2018-07-08 DIAGNOSIS — F321 Major depressive disorder, single episode, moderate: Secondary | ICD-10-CM

## 2018-07-08 DIAGNOSIS — F3341 Major depressive disorder, recurrent, in partial remission: Secondary | ICD-10-CM

## 2018-07-08 MED ORDER — FLUOXETINE HCL 20 MG PO CAPS: 20.0000 mg | ORAL_CAPSULE | Freq: Every day | ORAL | 1 refills | Status: DC

## 2018-07-08 MED ORDER — BUPROPION HCL ER (XL) 150 MG PO TB24: 150.0000 mg | ORAL_TABLET | Freq: Every day | ORAL | 1 refills | Status: DC

## 2018-07-08 MED FILL — FLUoxetine HCL 20 MG CAPS: 20 | 30 days supply | Qty: 30 | Fill #0

## 2018-07-08 MED FILL — buPROPion HCL ER (XL) 150 M: 150 | 30 days supply | Qty: 30 | Fill #0

## 2018-07-08 NOTE — Progress Notes (Signed)
BH MD/PA/NP OP Progress Note  07/08/2018 4:11 PM Rebecca Schroeder  MRN:  [id number]  Chief Complaint: Medication management appointment.  Assessment done via phone. HPI: Patient seen for medication management appointment, via phone.  Limitations/issues pertaining to phone session reviewed.  28 year old female.  History of chronic depression.  Has been diagnosed with MDD. She reports she has been functioning well in her daily activities, which include full-time work, homemaking, taking care of her young children, but describes some lingering depression.  Denies suicidal ideations and is future oriented.  She does endorse some neurovegetative symptoms, particularly of a tendency towards feeling tired , with a low energy level , and oversleeping.  She is now on Prozac, which she has tolerated well thus far.  She feels medication has helped partially, but states it is "hard to tell" because of current challenges posed by coronavirus epidemic, including social isolation.  She also points out that her job had recently switched her to nighttime shift, which was difficult for her as she had less time with her children and felt tired during the day-she is now back to regular daytime work hours. She does feel she had more energy when she was taking Wellbutrin XL.  She does not think she was having any side effects on the Wellbutrin XL and that headache she had reported was related to Lexapro rather than to Wellbutrin XL.  She denies any history of seizures, head trauma, eating disorder. Visit Diagnosis:  MDD Past Psychiatric History:   Past Medical History:  Past Medical History:  Diagnosis Date  . 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 Back pain    occasional that radiates down leg    Past Surgical History:  Procedure Laterality Date  . CESAREAN SECTION    . CESAREAN SECTION N/A 04/10/2017   Procedure: REPEAT CESAREAN SECTION;  Surgeon: Harold Hedge, MD;  Location: [facility];  Service: Obstetrics;   Laterality: N/A;  Repeat edc 04/17/17 allergy to codeine, hydrocodone, PCN need RNFA.Marland KitchenMarland KitchenMarland KitchenPersonnel officer RNFA  . TOE SURGERY    . WISDOM TOOTH EXTRACTION      Family Psychiatric History:   Family History:  Family History  Problem Relation Age of Onset  . Depression Mother   . Thyroid disease Mother   . Alcohol abuse Father   . Hypertension Maternal Grandfather   . Diabetes Maternal Grandfather   . Prostate cancer Maternal Grandfather     Social History:  Social History   Socioeconomic History  . Marital status: Single    Spouse name: Not on file  . Number of children: Not on file  . Years of education: Not on file  . Highest education level: Not on file  Occupational History  . Not on file  Social Needs  . Financial resource strain: Not on file  . Food insecurity:    Worry: Not on file    Inability: Not on file  . Transportation needs:    Medical: Not on file    Non-medical: Not on file  Tobacco Use  . Smoking status: Never Smoker  . Smokeless tobacco: Never Used  Substance and Sexual Activity  . Alcohol use: Yes    Alcohol/week: 0.0 standard drinks    Comment: occasionally  . Drug use: No  . Sexual activity: Yes    Birth control/protection: I.U.D.  Lifestyle  . Physical activity:    Days per week: Not on file    Minutes per session: Not on file  . Stress: Not  on file  Relationships  . Social connections:    Talks on phone: Not on file    Gets together: Not on file    Attends religious service: Not on file    Active member of club or organization: Not on file    Attends meetings of clubs or organizations: Not on file    Relationship status: Not on file  Other Topics Concern  . Not on file  Social History Narrative  . Not on file    Allergies:  Allergies  Allergen Reactions  . Penicillins Other (See Comments) and Rash    Unknown Has patient had a PCN reaction causing immediate rash, facial/tongue/throat swelling, SOB or lightheadedness with  hypotension: Unknown Has patient had a PCN reaction causing severe rash involving mucus membranes or skin necrosis: Unknown Has patient had a PCN reaction that required hospitalization: Unknown Has patient had a PCN reaction occurring within the last 10 years: Unknown If all of the above answers are "NO", then may proceed with Cephalosporin use. Childhood reaction.  . Codeine Nausea And Vomiting  . Hydrocodone-Acetaminophen Nausea And Vomiting  . Roxicodone [Oxycodone Hcl] Nausea And Vomiting and Other (See Comments)    SEVERE VOMITING----Patient DOES NOT tolerate with anti nausea medications    Metabolic Disorder Labs: No results found for: HGBA1C, MPG No results found for: PROLACTIN Lab Results  Component Value Date   CHOL 159 06/19/2017   TRIG 69 06/19/2017   HDL 50 (L) 06/19/2017   CHOLHDL 3.2 06/19/2017   LDLCALC 93 06/19/2017   Lab Results  Component Value Date   TSH 0.94 08/13/2017   TSH 0.82 07/25/2016    Therapeutic Level Labs: No results found for: LITHIUM No results found for: VALPROATE No components found for:  CBMZ  Current Medications: Current Outpatient Medications  Medication Sig Dispense Refill  . buPROPion (WELLBUTRIN XL) 150 MG 24 hr tablet Take 1 tablet (150 mg total) by mouth daily. 30 tablet 1  . etonogestrel-ethinyl estradiol (NUVARING) 0.12-0.015 MG/24HR vaginal ring Insert vaginally and leave in place for 3 consecutive weeks, then remove for 1 week. 3 each 12  . FLUoxetine (PROZAC) 20 MG capsule Take 1 capsule (20 mg total) by mouth daily. 30 capsule 1   No current facility-administered medications for this visit.       Psychiatric Specialty Exam: ROS denies headache, no chest pain or shortness of breath, no vomiting  not currently breastfeeding.There is no height or weight on file to calculate BMI.  General Appearance: NA  Eye Contact:  NA  Speech:  Normal Rate  Volume:  Normal  Mood:  Reports some improvement but endorses persistent  depression  Affect:  Appropriate-noted to laugh appropriately at times during session  Thought Process:  Linear and Descriptions of Associations: Intact  Orientation:  Full (Time, Place, and Person)  Thought Content: No hallucinations, no delusions   Suicidal Thoughts:  No denies suicidal or self-injurious ideations, no homicidal or violent ideations  Homicidal Thoughts:  No  Memory:  Recent and remote grossly intact  Judgement:  Other:  Present  Insight:  Present  Psychomotor Activity:  NA  Concentration:  Concentration: Good and Attention Span: Good  Recall:  Good  Fund of Knowledge: Good  Language: Good  Akathisia:  Negative  Handed:  Right  AIMS (if indicated):  AIMS test not done   Assets:  Communication Skills Desire for Improvement Physical Health Resilience  ADL's:  Intact  Cognition: WNL  Sleep:  reports sleeping" to much "  at times.   Screenings: GAD-7     Office Visit from 12/25/2017 in [facility]  Total GAD-7 Score  8    PHQ2-9     Office Visit from 12/25/2017 in [facility] Office Visit from 07/25/2016 in [facility] Office Visit from 05/10/2015 in [facility]  PHQ-2 Total Score  3  0  2  PHQ-9 Total Score  Assessment and Plan:  28 year old female, has 2 children, employed.  History of depression, which is characterized as chronic.  Has been diagnosed with MDD.  In April she had been switched from a combination of Wellbutrin XL/Lexapro to Prozac, based on limited response and headache.  She is tolerating Prozac well, and does endorse some improvement, but reports some persistent depression, subjective sense of low energy, some hypersomnia.  Denies any suicidal ideations and is functioning well in her daily activities including working full-time and taking care of her family. Of note, patient has a history of normal TSH in 2019.  Based on above report I have encouraged her to see  her PCP for work-up/rechecking TSH.  We discussed options.  At this time she wants to resume Wellbutrin XL in combination with Prozac, which she had been taking without side effects (states headache was related to Lexapro, not to Wellbutrin XL).  States she felt she had more energy while on Wellbutrin XL.  We reviewed side effects.  Plan-resume Wellbutrin XL 150 mg daily, continue Prozac 20 mg daily.  Side effects reviewed including risk of seizures associated with Wellbutrin. We will see in 4 to 6 weeks, agrees to contact me sooner should to be any worsening prior.   Craige Cotta, MD 07/08/2018, 4:11 PM

## 2018-07-21 ENCOUNTER — Encounter: Payer: Self-pay | Admitting: Family Medicine

## 2018-08-05 MED FILL — FLUoxetine HCL 20 MG CAPS: 20 | 30 days supply | Qty: 30 | Fill #1

## 2018-08-05 MED FILL — ETONOGESTREL-ETHINYL ESTRAD: 0.12-0.015 | 84 days supply | Qty: 3 | Fill #0

## 2018-08-05 MED FILL — buPROPion HCL ER (XL) 150 M: 150 | 30 days supply | Qty: 30 | Fill #1

## 2018-08-22 ENCOUNTER — Telehealth (HOSPITAL_COMMUNITY): Payer: Self-pay | Admitting: Psychiatry

## 2018-09-02 ENCOUNTER — Encounter (HOSPITAL_COMMUNITY): Payer: Self-pay | Admitting: Psychiatry

## 2018-09-02 ENCOUNTER — Ambulatory Visit (INDEPENDENT_AMBULATORY_CARE_PROVIDER_SITE_OTHER): Payer: No Typology Code available for payment source | Admitting: Psychiatry

## 2018-09-02 ENCOUNTER — Other Ambulatory Visit: Payer: Self-pay

## 2018-09-02 DIAGNOSIS — F3341 Major depressive disorder, recurrent, in partial remission: Secondary | ICD-10-CM

## 2018-09-02 NOTE — Progress Notes (Signed)
BH MD/PA/NP OP Progress Note  09/02/2018 11:06 AM Rebecca Schroeder  MRN:  [id number]  Chief Complaint: Medication management appointment HPI: This appointment conducted via phone.  Identity confirmed through 2 identifiers.  Limitations associated with this mode of communication reviewed. 28 year old female who has a history of depression/prior diagnosis of MDD. She had recently contacted clinic requesting appointment. She reports that she had recently found out that her boyfriend, with whom she lives, had been interacting with other females via social media without her knowledge.  She states this resulted in relationship stress and ultimately brief separation.  She states that in the context of this she developed some increased depression and also increased anxiety and renewed panic symptoms (has a prior history of panic attacks but reports they had generally resolved/improved over time).  At this time states she is feeling better.  She reports that although the relationship is still distant it has improved to the point where they have been able to go on vacation together with her children.  Currently denies significant depression or neurovegetative symptoms.  Denies anhedonia or pervasive sadness, denies any suicidal ideations.  She reports some increased anxiety due to above stressor but no recent full-blown panic attacks.  As mentioned she states she is currently on vacation and enjoying this time with her children.  She is taking Wellbutrin XL and Prozac, denies side effects Visit Diagnosis:  MDD Past Psychiatric History:   Past Medical History:  Past Medical History:  Diagnosis Date  . 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 Back pain    occasional that radiates down leg    Past Surgical History:  Procedure Laterality Date  . CESAREAN SECTION    . CESAREAN SECTION N/A 04/10/2017   Procedure: REPEAT CESAREAN SECTION;  Surgeon: Everlene Farrier, MD;  Location: [facility];  Service: Obstetrics;   Laterality: N/A;  Repeat edc 04/17/17 allergy to codeine, hydrocodone, PCN need RNFA.Marland KitchenMarland KitchenMarland KitchenAeronautical engineer RNFA  . TOE SURGERY    . WISDOM TOOTH EXTRACTION      Family Psychiatric History:   Family History:  Family History  Problem Relation Age of Onset  . Depression Mother   . Thyroid disease Mother   . Alcohol abuse Father   . Hypertension Maternal Grandfather   . Diabetes Maternal Grandfather   . Prostate cancer Maternal Grandfather     Social History:  Social History   Socioeconomic History  . Marital status: Single    Spouse name: Not on file  . Number of children: Not on file  . Years of education: Not on file  . Highest education level: Not on file  Occupational History  . Not on file  Social Needs  . Financial resource strain: Not on file  . Food insecurity    Worry: Not on file    Inability: Not on file  . Transportation needs    Medical: Not on file    Non-medical: Not on file  Tobacco Use  . Smoking status: Never Smoker  . Smokeless tobacco: Never Used  Substance and Sexual Activity  . Alcohol use: Yes    Alcohol/week: 0.0 standard drinks    Comment: occasionally  . Drug use: No  . Sexual activity: Yes    Birth control/protection: I.U.D.  Lifestyle  . Physical activity    Days per week: Not on file    Minutes per session: Not on file  . Stress: Not on file  Relationships  . Social connections    Talks  on phone: Not on file    Gets together: Not on file    Attends religious service: Not on file    Active member of club or organization: Not on file    Attends meetings of clubs or organizations: Not on file    Relationship status: Not on file  Other Topics Concern  . Not on file  Social History Narrative  . Not on file    Allergies:  Allergies  Allergen Reactions  . Penicillins Other (See Comments) and Rash    Unknown Has patient had a PCN reaction causing immediate rash, facial/tongue/throat swelling, SOB or lightheadedness with  hypotension: Unknown Has patient had a PCN reaction causing severe rash involving mucus membranes or skin necrosis: Unknown Has patient had a PCN reaction that required hospitalization: Unknown Has patient had a PCN reaction occurring within the last 10 years: Unknown If all of the above answers are "NO", then may proceed with Cephalosporin use. Childhood reaction.  . Codeine Nausea And Vomiting  . Hydrocodone-Acetaminophen Nausea And Vomiting  . Roxicodone [Oxycodone Hcl] Nausea And Vomiting and Other (See Comments)    SEVERE VOMITING----Patient DOES NOT tolerate with anti nausea medications    Metabolic Disorder Labs: No results found for: HGBA1C, MPG No results found for: PROLACTIN Lab Results  Component Value Date   CHOL 159 06/19/2017   TRIG 69 06/19/2017   HDL 50 (L) 06/19/2017   CHOLHDL 3.2 06/19/2017   LDLCALC 93 06/19/2017   Lab Results  Component Value Date   TSH 0.94 08/13/2017   TSH 0.82 07/25/2016    Therapeutic Level Labs: No results found for: LITHIUM No results found for: VALPROATE No components found for:  CBMZ  Current Medications: Current Outpatient Medications  Medication Sig Dispense Refill  . buPROPion (WELLBUTRIN XL) 150 MG 24 hr tablet Take 1 tablet (150 mg total) by mouth daily. 30 tablet 1  . etonogestrel-ethinyl estradiol (NUVARING) 0.12-0.015 MG/24HR vaginal ring Insert vaginally and leave in place for 3 consecutive weeks, then remove for 1 week. 3 each 12  . FLUoxetine (PROZAC) 20 MG capsule Take 1 capsule (20 mg total) by mouth daily. 30 capsule 1   No current facility-administered medications for this visit.        Psychiatric Specialty Exam: Please take into account limitations associated with obtaining a full mental site exam in the context of phone communication ROS  not currently breastfeeding.There is no height or weight on file to calculate BMI.  General Appearance: NA  Eye Contact:  NA  Speech:  Normal Rate  Volume:  Normal   Mood:  Reports her mood is "okay" at this time  Affect:  Appropriate and Reactive  Thought Process:  Linear and Descriptions of Associations: Intact  Orientation:  Full (Time, Place, and Person)  Thought Content: No hallucinations, no delusions   Suicidal Thoughts:  No denies suicidal or self-injurious ideations  Homicidal Thoughts:  No  Memory:  Recent and remote grossly intact  Judgement:  Good  Insight:  Good  Psychomotor Activity:  NA  Concentration:  Concentration: Good and Attention Span: Good  Recall:  Good  Fund of Knowledge: Good  Language: Good  Akathisia:  Negative  Handed:  Right  AIMS (if indicated):   Assets:  Communication Skills Desire for Improvement Resilience Social Support  ADL's:  Intact  Cognition: WNL  Sleep:  Good   Screenings: GAD-7     [facility]  Total GAD-7 Score  8  PHQ2-9     [facility]  PHQ-2 Total Score  3  0  2  PHQ-9 Total Score  12  12  14        Assessment and Plan: 28 year old female, history of depression and anxiety.  Has been diagnosed with MDD in the past.  Currently on Wellbutrin XL and Prozac.  Recently contacted clinic reporting some increased symptoms, particularly of anxiety, in the context of finding out that her boyfriend with whom she lives has been interacting with other females via social media without her knowledge.  At this time reports that she is doing well and describes feeling better than she did initially, does not endorse significant neurovegetative symptoms or any suicidal ideations and states she is currently on vacation with her children and boyfriend and able to enjoy herself.  She reports some increased anxiety in the context of above stressors but overall describes feeling better.  She is  tolerating Wellbutrin XL and Prozac well.  We have reviewed side effect profiles. Continue current medication regimen, no medication changes at this time.  Does not currently need medication renewed.  We will schedule an appointment for a month from now, she agrees to contact clinic sooner should to be any worsening or concern prior.   Craige CottaFernando A Lashawn Bromwell, MD 09/02/2018, 11:06 AM

## 2018-09-10 ENCOUNTER — Encounter: Payer: Self-pay | Admitting: Family Medicine

## 2018-09-12 ENCOUNTER — Ambulatory Visit (INDEPENDENT_AMBULATORY_CARE_PROVIDER_SITE_OTHER): Payer: No Typology Code available for payment source | Admitting: Family Medicine

## 2018-09-12 ENCOUNTER — Other Ambulatory Visit: Payer: Self-pay

## 2018-09-12 ENCOUNTER — Encounter: Payer: Self-pay | Admitting: Family Medicine

## 2018-09-12 VITALS — BP 128/68 | HR 86 | Temp 98.5°F | Resp 14 | Ht 62.0 in | Wt 183.0 lb

## 2018-09-12 DIAGNOSIS — F439 Reaction to severe stress, unspecified: Secondary | ICD-10-CM | POA: Diagnosis not present

## 2018-09-12 DIAGNOSIS — N926 Irregular menstruation, unspecified: Secondary | ICD-10-CM

## 2018-09-12 DIAGNOSIS — F418 Other specified anxiety disorders: Secondary | ICD-10-CM

## 2018-09-12 LAB — CBC WITH DIFFERENTIAL/PLATELET
Absolute Monocytes: 535 cells/uL (ref 200–950)
Basophils Absolute: 30 cells/uL (ref 0–200)
Basophils Relative: 0.3 %
Eosinophils Absolute: 89 cells/uL (ref 15–500)
Eosinophils Relative: 0.9 %
HCT: 41.5 % (ref 35.0–45.0)
Hemoglobin: 13.7 g/dL (ref 11.7–15.5)
Lymphs Abs: 2445 cells/uL (ref 850–3900)
MCH: 27.8 pg (ref 27.0–33.0)
MCHC: 33 g/dL (ref 32.0–36.0)
MCV: 84.2 fL (ref 80.0–100.0)
MPV: 10.2 fL (ref 7.5–12.5)
Monocytes Relative: 5.4 %
Neutro Abs: 6801 cells/uL (ref 1500–7800)
Neutrophils Relative %: 68.7 %
Platelets: 239 10*3/uL (ref 140–400)
RBC: 4.93 10*6/uL (ref 3.80–5.10)
RDW: 13.4 % (ref 11.0–15.0)
Total Lymphocyte: 24.7 %
WBC: 9.9 10*3/uL (ref 3.8–10.8)

## 2018-09-12 LAB — BASIC METABOLIC PANEL
BUN: 13 mg/dL (ref 7–25)
CO2: 27 mmol/L (ref 20–32)
Calcium: 9 mg/dL (ref 8.6–10.2)
Chloride: 102 mmol/L (ref 98–110)
Creat: 0.75 mg/dL (ref 0.50–1.10)
Glucose, Bld: 124 mg/dL — ABNORMAL HIGH (ref 65–99)
Potassium: 3.9 mmol/L (ref 3.5–5.3)
Sodium: 137 mmol/L (ref 135–146)

## 2018-09-12 LAB — HCG, QUANTITATIVE, PREGNANCY: HCG, Total, QN: <3 m[IU]/mL

## 2018-09-12 NOTE — Patient Instructions (Addendum)
We will call with lab results  F/u Pending results

## 2018-09-12 NOTE — Progress Notes (Signed)
Subjective:    Patient ID: Rebecca Schroeder, female    DOB: 04/27/1990, 28 y.o.   MRN: [id number]  Patient presents for Stress/Anxiety  Pt sent my chart message. Increased stressors dealing with seperation from husband, has 2 young kids, working full time as a Marine scientist. She has history of MDD and anxiety. Currently on wellbutrin  And prozac She is followed by psychiatry Dr. Parke Poisson With all the stress, her last 2 cycles have only lasted 1 day ( 4th of July, 8/1 had liht cycle), very light which is very unusual, she had had breast tenderness as well. She did take a couple of pregnancy test and both were negative.  Also had some mild abdominal cramping States that there was some emotional infidelity within her relationship and some other stressors which is why they are separating.  They are going to coparent their 2 boys.  Using Nuva Ring for OCP Has not nursed in > 8 months occasionally can squeeze milk out of the breast   Has been off Prozac for 2 weeks -she forgot it when she went on vacation and then realized that she was can have some alcohol that weekend so decided not to start it and after her period screwed up this past weekend she was still very nervous about starting so has not.    [facility] on [address] has an appointment coming up   Review Of Systems:  GEN- denies fatigue, fever, weight loss,weakness, recent illness HEENT- denies eye drainage, change in vision, nasal discharge, CVS- denies chest pain, palpitations RESP- denies SOB, cough, wheeze ABD- denies N/V, change in stools, abd pain GU- denies dysuria, hematuria, dribbling, incontinence MSK- denies joint pain, muscle aches, injury Neuro- denies headache, dizziness, syncope, seizure activity       Objective:    BP 128/68   Pulse 86   Temp 98.5 F (36.9 C) (Oral)   Resp 14   Ht 5\' 2"  (1.575 m)   Wt 183 lb (83 kg)   SpO2 98%   BMI 33.47 kg/m  GEN- NAD, alert and oriented x3 HEENT- PERRL, EOMI,  non injected sclera, pink conjunctiva, MMM, oropharynx clear Neck- Supple, no thyromegaly CVS- RRR, no murmur RESP-CTAB ABD-NABS,soft,NT,ND Psych- stressed appearing, not overly anxious, well groomed, normal speech,good eye contact  EXT- No edema Pulses- Radial 2+        Assessment & Plan:      Problem List Items Addressed This Visit      Unprioritized   Depression with anxiety    For her irregular menstrual cycle we will go ahead and obtain an hCG quant as well as metabolic and a CBC.  Is possible just the stressors over the past couple months that her.  All but having to light cycles is fairly abnormal for her.  She can continue to use the NuvaRing at this time.  If she is indeed pregnant we will get her set up with OB/GYN as soon as possible.  She can continue the Wellbutrin.  If her labs come back negative she is not pregnant I recommend that she go ahead and start the Prozac and follow-up with her psychiatrist as previously scheduled       Other Visit Diagnoses    Irregular menses    -  Primary   Relevant Orders   CBC with Differential/Platelet   Basic metabolic panel   hCG, quantitative, pregnancy   Stress          Note: This  dictation was prepared with Dragon dictation along with smaller phrase technology. Any transcriptional errors that result from this process are unintentional.

## 2018-09-12 NOTE — Assessment & Plan Note (Signed)
For her irregular menstrual cycle we will go ahead and obtain an hCG quant as well as metabolic and a CBC.  Is possible just the stressors over the past couple months that her.  All but having to light cycles is fairly abnormal for her.  She can continue to use the NuvaRing at this time.  If she is indeed pregnant we will get her set up with OB/GYN as soon as possible.  She can continue the Wellbutrin.  If her labs come back negative she is not pregnant I recommend that she go ahead and start the Prozac and follow-up with her psychiatrist as previously scheduled

## 2018-09-29 ENCOUNTER — Other Ambulatory Visit (HOSPITAL_COMMUNITY): Payer: Self-pay | Admitting: Psychiatry

## 2018-09-29 DIAGNOSIS — F321 Major depressive disorder, single episode, moderate: Secondary | ICD-10-CM

## 2018-10-02 ENCOUNTER — Other Ambulatory Visit (HOSPITAL_COMMUNITY): Payer: Self-pay

## 2018-10-02 DIAGNOSIS — F321 Major depressive disorder, single episode, moderate: Secondary | ICD-10-CM

## 2018-10-02 MED ORDER — BUPROPION HCL ER (XL) 150 MG PO TB24: 150.0000 mg | ORAL_TABLET | Freq: Every day | ORAL | 1 refills | Status: DC

## 2018-10-02 MED ORDER — FLUOXETINE HCL 20 MG PO CAPS: 20.0000 mg | ORAL_CAPSULE | Freq: Every day | ORAL | 1 refills | Status: DC

## 2018-10-02 MED FILL — FLUoxetine HCL 20 MG CAPS: 20 | 30 days supply | Qty: 30 | Fill #0

## 2018-10-02 MED FILL — buPROPion HCL ER (XL) 150 M: 150 | 30 days supply | Qty: 30 | Fill #0

## 2018-10-22 MED FILL — ETONOGESTREL-ETHINYL ESTRAD: 0.12-0.015 | 84 days supply | Qty: 3 | Fill #1

## 2018-10-28 ENCOUNTER — Ambulatory Visit (INDEPENDENT_AMBULATORY_CARE_PROVIDER_SITE_OTHER): Payer: No Typology Code available for payment source | Admitting: Psychiatry

## 2018-10-28 ENCOUNTER — Other Ambulatory Visit: Payer: Self-pay

## 2018-10-28 ENCOUNTER — Encounter (HOSPITAL_COMMUNITY): Payer: Self-pay | Admitting: Psychiatry

## 2018-10-28 DIAGNOSIS — F3341 Major depressive disorder, recurrent, in partial remission: Secondary | ICD-10-CM | POA: Diagnosis not present

## 2018-10-28 DIAGNOSIS — F321 Major depressive disorder, single episode, moderate: Secondary | ICD-10-CM | POA: Diagnosis not present

## 2018-10-28 MED ORDER — FLUOXETINE HCL 10 MG PO CAPS: 10.0000 mg | ORAL_CAPSULE | Freq: Every day | ORAL | 1 refills | Status: DC

## 2018-10-28 MED ORDER — BUPROPION HCL ER (XL) 300 MG PO TB24: 300.0000 mg | ORAL_TABLET | Freq: Every day | ORAL | 1 refills | Status: DC

## 2018-10-28 MED FILL — buPROPion HCL ER (XL) 300 M: 300 | 30 days supply | Qty: 30 | Fill #0

## 2018-10-28 NOTE — Progress Notes (Signed)
BH MD/PA/NP OP Progress Note  10/28/2018 3:56 PM Rebecca Schroeder  MRN:  [id number]  Chief Complaint: Medication management appointment HPI: This medication management appointment was conducted via phone due to coronavirus epidemic restrictions.  Patient's identity verified via 2 identifiers.  Limitations related to this mode of communication have been reviewed. 28 year old female, 2 children, lives with fianc and her children.  Employed.  History of depression and anxiety.  Has been diagnosed with MDD. Describes partially improved mood but endorses some persistent depression and residual neurovegetative symptoms such as suboptimal sleep, subjective sense of low energy level, mild anhedonia.  No psychotic symptoms.  No suicidal or self-injurious ideations and remains future oriented.. Patient reports that in general she is been doing well in daily activities which include homemaking, taking care of her children/family , and working.  She describes some ongoing chronic stressors which include relationship strain with boyfriend, although states that this is gradually improving (he had moved out for period of time but has since returned home).  She also describes her job as intermittently stressful. She is currently on Wellbutrin XL 150 mg daily and on Prozac 20 mg daily.  Reports medications as partially helpful with some improvement in mood but not complete resolution of depressive symptoms/these medications have generally been well-tolerated but she endorses some sexual side effects which may be secondary to Prozac.  Visit Diagnosis: MDD  Past Psychiatric History:   Past Medical History:  Past Medical History:  Diagnosis Date  . 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 Back pain    occasional that radiates down leg    Past Surgical History:  Procedure Laterality Date  . CESAREAN SECTION    . CESAREAN SECTION N/A 04/10/2017   Procedure: REPEAT CESAREAN SECTION;  Surgeon: Everlene Farrier, MD;  Location:  [facility];  Service: Obstetrics;  Laterality: N/A;  Repeat edc 04/17/17 allergy to codeine, hydrocodone, PCN need RNFA.Marland KitchenMarland KitchenMarland KitchenAeronautical engineer RNFA  . TOE SURGERY    . WISDOM TOOTH EXTRACTION      Family Psychiatric History:   Family History:  Family History  Problem Relation Age of Onset  . Depression Mother   . Thyroid disease Mother   . Alcohol abuse Father   . Hypertension Maternal Grandfather   . Diabetes Maternal Grandfather   . Prostate cancer Maternal Grandfather     Social History:  Social History   Socioeconomic History  . Marital status: Single    Spouse name: Not on file  . Number of children: Not on file  . Years of education: Not on file  . Highest education level: Not on file  Occupational History  . Not on file  Social Needs  . Financial resource strain: Not on file  . Food insecurity    Worry: Not on file    Inability: Not on file  . Transportation needs    Medical: Not on file    Non-medical: Not on file  Tobacco Use  . Smoking status: Never Smoker  . Smokeless tobacco: Never Used  Substance and Sexual Activity  . Alcohol use: Yes    Alcohol/week: 0.0 standard drinks    Comment: occasionally  . Drug use: No  . Sexual activity: Yes    Birth control/protection: I.U.D.  Lifestyle  . Physical activity    Days per week: Not on file    Minutes per session: Not on file  . Stress: Not on file  Relationships  . Social Herbalist on phone:  Not on file    Gets together: Not on file    Attends religious service: Not on file    Active member of club or organization: Not on file    Attends meetings of clubs or organizations: Not on file    Relationship status: Not on file  Other Topics Concern  . Not on file  Social History Narrative  . Not on file    Allergies:  Allergies  Allergen Reactions  . Penicillins Other (See Comments) and Rash    Unknown Has patient had a PCN reaction causing immediate rash, facial/tongue/throat  swelling, SOB or lightheadedness with hypotension: Unknown Has patient had a PCN reaction causing severe rash involving mucus membranes or skin necrosis: Unknown Has patient had a PCN reaction that required hospitalization: Unknown Has patient had a PCN reaction occurring within the last 10 years: Unknown If all of the above answers are "NO", then may proceed with Cephalosporin use. Childhood reaction.  . Codeine Nausea And Vomiting  . Hydrocodone-Acetaminophen Nausea And Vomiting  . Roxicodone [Oxycodone Hcl] Nausea And Vomiting and Other (See Comments)    SEVERE VOMITING----Patient DOES NOT tolerate with anti nausea medications    Metabolic Disorder Labs: No results found for: HGBA1C, MPG No results found for: PROLACTIN Lab Results  Component Value Date   CHOL 159 06/19/2017   TRIG 69 06/19/2017   HDL 50 (L) 06/19/2017   CHOLHDL 3.2 06/19/2017   LDLCALC 93 06/19/2017   Lab Results  Component Value Date   TSH 0.94 08/13/2017   TSH 0.82 07/25/2016    Therapeutic Level Labs: No results found for: LITHIUM No results found for: VALPROATE No components found for:  CBMZ  Current Medications: Current Outpatient Medications  Medication Sig Dispense Refill  . buPROPion (WELLBUTRIN XL) 150 MG 24 hr tablet Take 1 tablet (150 mg total) by mouth daily. 30 tablet 1  . etonogestrel-ethinyl estradiol (NUVARING) 0.12-0.015 MG/24HR vaginal ring Insert vaginally and leave in place for 3 consecutive weeks, then remove for 1 week. 3 each 12  . FLUoxetine (PROZAC) 20 MG capsule Take 1 capsule (20 mg total) by mouth daily. 30 capsule 1   No current facility-administered medications for this visit.       Psychiatric Specialty Exam: Please take into account limitations in obtaining a full mental site exam in the context of phone communication ROS describes occasional nausea, no vomiting.  Reports some sexual side effects which may be related to SSRI (decreased libido), denies any seizures or  seizure-like episodes and has no history of seizures.  not currently breastfeeding.There is no height or weight on file to calculate BMI.  General Appearance: NA  Eye Contact:  NA  Speech:  Normal Rate  Volume:  Normal  Mood:  Reports partially improved mood but describes some lingering depression  Affect:  Appropriate and Laughs briefly/appropriately at times during session  Thought Process:  Linear and Descriptions of Associations: Intact  Orientation:  Full (Time, Place, and Person)  Thought Content: No hallucinations, no delusions   Suicidal Thoughts:  No denies suicidal or self-injurious ideations, denies homicidal or violent ideations  Homicidal Thoughts:  No  Memory:  Recent and remote grossly intact  Judgement:  Other:  Present  Insight:  Present  Psychomotor Activity:  NA  Concentration:  Concentration: Good and Attention Span: Good  Recall:  Good  Fund of Knowledge: Good  Language: Good  Akathisia:  Negative  Handed:  Right  AIMS (if indicated): Does not endorse abnormal or involuntary  movements  Assets:  Communication Skills Desire for Improvement Resilience Others:  Employed  Sense of responsibility to her family  ADL's:  Intact  Cognition: WNL  Sleep:  Reports fair sleep   Screenings: GAD-7     Office Visit from 09/12/2018 in [facility] Office Visit from 12/25/2017 in [facility]  Total GAD-7 Score  20  8    PHQ2-9     Office Visit from 09/12/2018 in [facility] Office Visit from 12/25/2017 in [facility] Office Visit from 07/25/2016 in [facility] Office Visit from 05/10/2015 in [facility]  PHQ-2 Total Score  3  3  0  2  PHQ-9 Total Score  17  12  12  14        Assessment and Plan:   28 year old female.  History of depression and anxiety.  Has been diagnosed with MDD.  Currently reports she is functioning well in her daily activities which include homemaking  and employment.  Does describe some lingering/residual symptoms of depression to include a vague/mild but persistent sense of anhedonia and intermittent feelings of sadness.  Denies any suicidal ideations, presents future oriented.  No psychotic symptoms.  She is on Wellbutrin XL and Prozac, which have been well-tolerated and are described as working partially in alleviating her symptoms.  She does report Prozac may be causing sexual dysfunction.  We discussed options-at this time prefers to continue current medication regimen rather than switch to another antidepressant. We will increase Wellbutrin XL from current 150 mg daily up to 300 mg daily.  Side effects to include risk of cardiovascular side effects and risk of seizure have been reviewed We will decrease Prozac from current 20 mg daily down to 10 mg daily. Next appointment in 4 to 6 weeks.  Patient agrees to contact clinic sooner should to be any worsening or concern prior   Craige Cotta, MD 10/28/2018, 3:56 PM

## 2018-11-03 MED FILL — FLUoxetine HCL 10 MG CAPS: 10 | 30 days supply | Qty: 30 | Fill #0

## 2018-11-04 ENCOUNTER — Telehealth (HOSPITAL_COMMUNITY): Payer: Self-pay

## 2018-11-04 NOTE — Telephone Encounter (Signed)
Patient is calling to see if she can get something for Anxiety. She states that she is going through a custody battle right now and it is very stressful.

## 2018-11-06 ENCOUNTER — Other Ambulatory Visit (HOSPITAL_COMMUNITY): Payer: Self-pay

## 2018-11-06 ENCOUNTER — Encounter: Payer: Self-pay | Admitting: Psychiatry

## 2018-11-06 MED ORDER — LORAZEPAM 0.5 MG PO TABS: ORAL_TABLET | ORAL | 0 refills | Status: DC

## 2018-11-06 NOTE — Telephone Encounter (Signed)
Per Dr. Parke Poisson, I sent in Ativan 0.5 mg 1 po q 12 hours as needed #8 with 0 refills. I called patient and advised her as well.

## 2018-11-06 NOTE — Progress Notes (Unsigned)
Patient ID: Rebecca Schroeder, female   DOB: 04-Oct-1990, 28 y.o.   MRN: [id number] 11/06/2018 at 3:30 PM  Patient recently contacted clinic staff/medical assistant reporting some increased anxiety in the context of being involved in a custody battle.  Requested medication for anxiety.  Current medications are Wellbutrin XL 300 mg daily and Prozac 10 mg daily.  Agreed to prescribe Ativan 0.5 mg every 12 hours as needed for anxiety #8 no refills.  Patient has follow-up appointment in 2 weeks and has agreed to contact clinic sooner should be any worsening or concern prior. Gabriel Earing MD

## 2018-11-07 MED FILL — LORazepam 0.5 MG TABS: 0.5 | 4 days supply | Qty: 8 | Fill #0

## 2018-11-25 ENCOUNTER — Ambulatory Visit (INDEPENDENT_AMBULATORY_CARE_PROVIDER_SITE_OTHER): Payer: No Typology Code available for payment source | Admitting: Psychiatry

## 2018-11-25 ENCOUNTER — Encounter (HOSPITAL_COMMUNITY): Payer: Self-pay | Admitting: Psychiatry

## 2018-11-25 ENCOUNTER — Other Ambulatory Visit: Payer: Self-pay

## 2018-11-25 DIAGNOSIS — F3341 Major depressive disorder, recurrent, in partial remission: Secondary | ICD-10-CM | POA: Diagnosis not present

## 2018-11-25 MED ORDER — SERTRALINE HCL 50 MG PO TABS: 50.0000 mg | ORAL_TABLET | Freq: Every day | ORAL | 0 refills | Status: DC

## 2018-11-25 MED FILL — SERTRALINE HCL 50 MG TABLET: 50 | 30 days supply | Qty: 30 | Fill #0
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
## 2018-12-23 ENCOUNTER — Other Ambulatory Visit: Payer: Self-pay

## 2018-12-24 ENCOUNTER — Encounter: Payer: Self-pay | Admitting: Family Medicine

## 2018-12-24 ENCOUNTER — Ambulatory Visit (INDEPENDENT_AMBULATORY_CARE_PROVIDER_SITE_OTHER): Payer: No Typology Code available for payment source | Admitting: Family Medicine

## 2018-12-24 VITALS — BP 128/64 | HR 90 | Temp 98.7°F | Resp 14 | Ht 62.0 in | Wt 183.0 lb

## 2018-12-24 DIAGNOSIS — F418 Other specified anxiety disorders: Secondary | ICD-10-CM

## 2018-12-24 DIAGNOSIS — E669 Obesity, unspecified: Secondary | ICD-10-CM | POA: Diagnosis not present

## 2018-12-24 DIAGNOSIS — Z0000 Encounter for general adult medical examination without abnormal findings: Secondary | ICD-10-CM

## 2018-12-24 NOTE — Patient Instructions (Signed)
F/U 1 year for physical  Schedule with GYN Zevia soda
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
## 2018-12-25 ENCOUNTER — Encounter: Payer: Self-pay | Admitting: Family Medicine

## 2018-12-25 DIAGNOSIS — E66811 Obesity, class 1: Secondary | ICD-10-CM | POA: Insufficient documentation

## 2018-12-25 DIAGNOSIS — E669 Obesity, unspecified: Secondary | ICD-10-CM | POA: Insufficient documentation

## 2018-12-25 NOTE — Assessment & Plan Note (Signed)
Significant stressors, she is following with therapist and psychiatrist who are managing meds Pt to contact us if she has any concerns or problems

## 2018-12-25 NOTE — Assessment & Plan Note (Signed)
Discussed cutting back her sugar especially in soda and coffee first, increase water  Also eats lots of carbs at lunch in form of frozen pasta meals Discussed some alternatives

## 2019-01-06 ENCOUNTER — Other Ambulatory Visit (HOSPITAL_COMMUNITY): Payer: Self-pay

## 2019-01-06 NOTE — Telephone Encounter (Signed)
Patient called and stated that she wants her FMLA extended. Also, patient requesting a refill on her Sertraline 50mg  and her Bubropion 300mg . She stated that she's been taking the Ativan 0.5mg  for a few days now and she doesn't feel like it's working. Please review and advise. Thank you.

## 2019-01-07 ENCOUNTER — Telehealth (HOSPITAL_COMMUNITY): Payer: Self-pay

## 2019-01-07 NOTE — Telephone Encounter (Signed)
Patient called and requested her FMLA paperwork be backdated because she wants more days. Please review and advise. Thank you.

## 2019-01-08 NOTE — Telephone Encounter (Signed)
Hi I am unsure how long her  FMLA was for but would need to see her for appointment to detrermine how she is doing. Please contact her and ask her to make a follow up appointment and to advise what her original FMLA covered dates were . Thanks Dr. Parke Poisson

## 2019-01-08 NOTE — Telephone Encounter (Signed)
Relayed message to patient. Agreed to wait and speak with doctor at her appointment regarding her FMLA and a 90 day refill on her prescriptions.

## 2019-01-09 ENCOUNTER — Other Ambulatory Visit (HOSPITAL_COMMUNITY): Payer: Self-pay

## 2019-01-09 MED ORDER — SERTRALINE HCL 50 MG PO TABS: 50.0000 mg | ORAL_TABLET | Freq: Every day | ORAL | 0 refills | Status: DC

## 2019-01-09 MED ORDER — BUPROPION HCL ER (XL) 300 MG PO TB24: 300.0000 mg | ORAL_TABLET | Freq: Every day | ORAL | 0 refills | Status: DC

## 2019-01-09 MED FILL — SERTRALINE HCL 50 MG TABS: 50 | 30 days supply | Qty: 30 | Fill #0

## 2019-01-09 MED FILL — BUPROPION HCL XL 300 MG TAB: 300 | 30 days supply | Qty: 30 | Fill #0

## 2019-01-12 ENCOUNTER — Ambulatory Visit (HOSPITAL_COMMUNITY): Payer: No Typology Code available for payment source | Admitting: Psychiatry

## 2019-01-13 ENCOUNTER — Other Ambulatory Visit: Payer: Self-pay | Admitting: Family Medicine

## 2019-01-13 MED FILL — ETONOGESTREL-ETHINYL ESTRAD: 0.12-0.015 | 84 days supply | Qty: 3 | Fill #0

## 2019-01-20 ENCOUNTER — Ambulatory Visit (INDEPENDENT_AMBULATORY_CARE_PROVIDER_SITE_OTHER): Payer: No Typology Code available for payment source | Admitting: Psychiatry

## 2019-01-20 ENCOUNTER — Other Ambulatory Visit (HOSPITAL_COMMUNITY): Payer: Self-pay | Admitting: Psychiatry

## 2019-01-20 ENCOUNTER — Encounter (HOSPITAL_COMMUNITY): Payer: Self-pay | Admitting: Psychiatry

## 2019-01-20 ENCOUNTER — Other Ambulatory Visit: Payer: Self-pay

## 2019-01-20 DIAGNOSIS — F3341 Major depressive disorder, recurrent, in partial remission: Secondary | ICD-10-CM

## 2019-01-20 MED ORDER — SERTRALINE HCL 50 MG PO TABS: 75.0000 mg | ORAL_TABLET | Freq: Every day | ORAL | 1 refills | Status: DC

## 2019-01-20 MED ORDER — BUPROPION HCL ER (XL) 300 MG PO TB24: 300.0000 mg | ORAL_TABLET | Freq: Every day | ORAL | 1 refills | Status: DC

## 2019-01-20 NOTE — Progress Notes (Signed)
BH MD/PA/NP OP Progress Note  01/20/2019 3:16 PM Rebecca Schroeder  MRN:  [id number]  Chief Complaint: Medication management appointment  HPI: This appointment was held via phone due to Covid epidemic precautions.  Patient's identity has been verified using 2 different identifiers.  Limitations associated with this mode of communication have been reviewed.  28 year old female, has 2 children, employed.  History of depression which has been described as chronic with a recurrent/waxing and waning course.  There is no history of psychiatric admissions or of suicide attempts. At this time patient reports overall improved mood and decreased symptoms of depression.  She does endorse some lingering symptoms, primarily of anxiety, which she attributes to current psychosocial/family stressors. She is currently separated and states that her relationship with children's father has been contentious and difficult.  In fact, states that they accused her of physically assaulting him due to which she was briefly detained/arrested.  He has also contested custody issues and she has an upcoming custody hearing court date in February. She continues to function well in daily activities to include work and home but does state that there has been increased anxiety symptoms and describes increased anxiety and occasional panic symptoms associated with the stressors. Denies any suicidal ideations, denies any homicidal or violent ideations and specifically denies any violent ideations towards exSO.  She is currently on Wellbutrin XL and Zoloft.  States that she is tolerating these medications well.  She reports she has noticed partial improvement on Zoloft and states "I like to Zoloft I think it is making a difference" .  Denies side effects thus far.  History of not tolerating a prior SSRI trial well due to sexual dysfunction but thus far tolerating Zoloft without side effects.  Currently does not endorse significant  neurovegetative symptoms.    Visit Diagnosis:  MDD, Anxiety Past Psychiatric History:  Past Medical History:  Past Medical History:  Diagnosis Date  . 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 Back pain    occasional that radiates down leg    Past Surgical History:  Procedure Laterality Date  . CESAREAN SECTION    . CESAREAN SECTION N/A 04/10/2017   Procedure: REPEAT CESAREAN SECTION;  Surgeon: Harold Hedge, MD;  Location: [facility] BIR[facility]ING SU[facility];  Service: Obstetrics;  Laterality: N/A;  Repeat edc 04/17/17 allergy to codeine, hydrocodone, PCN need RNFA.Marland KitchenMarland KitchenMarland KitchenPersonnel officer RNFA  . TOE SURGERY    . WISDOM TOOTH EXTRACTION      Family Psychiatric History:   Family History:  Family History  Problem Relation Age of Onset  . Depression Mother   . Thyroid disease Mother   . Alcohol abuse Father   . Hypertension Maternal Grandfather   . Diabetes Maternal Grandfather   . Prostate cancer Maternal Grandfather     Social History:  Social History   Socioeconomic History  . Marital status: Single    Spouse name: Not on file  . Number of children: Not on file  . Years of education: Not on file  . Highest education level: Not on file  Occupational History  . Not on file  Tobacco Use  . Smoking status: Never Smoker  . Smokeless tobacco: Never Used  Substance and Sexual Activity  . Alcohol use: Yes    Alcohol/week: 0.0 standard drinks    Comment: occasionally  . Drug use: No  . Sexual activity: Yes    Birth control/protection: I.U.D.  Other Topics Concern  . Not on file  Social History Narrative  .  Not on file   Social Determinants of Health   Financial Resource Strain:   . Difficulty of Paying Living Expenses: Not on file  Food Insecurity:   . Worried About Programme researcher, broadcasting/film/videounning Out of Food in the Last Year: Not on file  . Ran Out of Food in the Last Year: Not on file  Transportation Needs:   . Lack of Transportation (Medical): Not on file  . Lack of Transportation (Non-Medical): Not on  file  Physical Activity:   . Days of Exercise per Week: Not on file  . Minutes of Exercise per Session: Not on file  Stress:   . Feeling of Stress : Not on file  Social Connections:   . Frequency of Communication with Friends and Family: Not on file  . Frequency of Social Gatherings with Friends and Family: Not on file  . Attends Religious Services: Not on file  . Active Member of Clubs or Organizations: Not on file  . Attends BankerClub or Organization Meetings: Not on file  . Marital Status: Not on file    Allergies:  Allergies  Allergen Reactions  . Penicillins Other (See Comments) and Rash    Unknown Has patient had a PCN reaction causing immediate rash, facial/tongue/throat swelling, SOB or lightheadedness with hypotension: Unknown Has patient had a PCN reaction causing severe rash involving mucus membranes or skin necrosis: Unknown Has patient had a PCN reaction that required hospitalization: Unknown Has patient had a PCN reaction occurring within the last 10 years: Unknown If all of the above answers are "NO", then may proceed with Cephalosporin use. Childhood reaction.  . Codeine Nausea And Vomiting  . Hydrocodone-Acetaminophen Nausea And Vomiting  . Roxicodone [Oxycodone Hcl] Nausea And Vomiting and Other (See Comments)    SEVERE VOMITING----Patient DOES NOT tolerate with anti nausea medications    Metabolic Disorder Labs: No results found for: HGBA1C, MPG No results found for: PROLACTIN Lab Results  Component Value Date   CHOL 159 06/19/2017   TRIG 69 06/19/2017   HDL 50 (L) 06/19/2017   CHOLHDL 3.2 06/19/2017   LDLCALC 93 06/19/2017   Lab Results  Component Value Date   TSH 0.94 08/13/2017   TSH 0.82 07/25/2016    Therapeutic Level Labs: No results found for: LITHIUM No results found for: VALPROATE No components found for:  CBMZ  Current Medications: Current Outpatient Medications  Medication Sig Dispense Refill  . buPROPion (WELLBUTRIN XL) 300 MG 24 hr  tablet Take 1 tablet (300 mg total) by mouth daily. 30 tablet 0  . etonogestrel-ethinyl estradiol (NUVARING) 0.12-0.015 MG/24HR vaginal ring INSERT 1 RING VAGINALLY. LEAVE IN FOR 3 WEEKS AND REMOVE FOR 1 WEEK 3 each 9  . LORazepam (ATIVAN) 0.5 MG tablet May take 1 tablet q 12 hours as needed for anxiety 8 tablet 0  . sertraline (ZOLOFT) 50 MG tablet Take 1 tablet (50 mg total) by mouth daily. 30 tablet 0   No current facility-administered medications for this visit.      Psychiatric Specialty Exam: Please take into account limitations in obtaining a full MSE in the context of phone communication Review of Systems does not endorse sertraline side effects  not currently breastfeeding.There is no height or weight on file to calculate BMI.  General Appearance: NA  Eye Contact:  NA  Speech:  Normal Rate  Volume:  Normal  Mood:  Reports overall improved mood, describes some lingering anxiety  Affect:  Appropriate  Thought Process:  Linear and Descriptions of Associations: Intact  Orientation:  Full (Time, Place, and Person)  Thought Content: No hallucinations, no delusions   Suicidal Thoughts:  No currently denies suicidal or self-injurious ideations, also denies homicidal thoughts, and specifically denies any HI towards ex significant other  Homicidal Thoughts:  No  Memory:  Recent and remote grossly intact  Judgement:  Intact  Insight:  Present  Psychomotor Activity:  NA  Concentration:  Concentration: Good and Attention Span: Good  Recall:  Good  Fund of Knowledge: Good  Language: Good  Akathisia:  Negative  Handed:  Right  AIMS (if indicated):   Assets:  Desire for Improvement Housing Resilience Others:  Employment  ADL's:  Intact  Cognition: WNL  Sleep:  Good   Screenings: GAD-7     Office Visit from 09/12/2018 in [place] Visit from 12/25/2017 in [place]  Total GAD-7 Score  20  8    PHQ2-9     Office Visit from 12/24/2018  in [place] Office Visit from 09/12/2018 in [place] Office Visit from 12/25/2017 in [place] Office Visit from 07/25/2016 in [place] Office Visit from 05/10/2015 in [place]  PHQ-2 Total Score  3  3  3   0  2  PHQ-9 Total Score  8  17  12  12  14        Assessment and Plan:  28 year old female, 2 children, employed. History of depression and anxiety.  Has been diagnosed with MDD in the past. Currently on Wellbutrin XL and Zoloft (has been on Wellbutrin for a long period of time, Zoloft was started a few weeks ago.  Of note, she had recently been prescribed low-dose Ativan on a as needed basis for anxiety.  States she took this medication only occasionally, does not feel it worked well for her and so discontinued it.  She is no longer taking Ativan at this time. Reports partial improvement on current medication regimen but describes lingering anxiety/apprehension in the context of significant stressors, mainly pertaining to strained relationship with her ex significant other/father of her children whom she reports is now contesting her custody of children with an upcoming court date early next year. We reviewed options: As she reports that Zoloft is well-tolerated and feels it is helping, will increase dose to 75 mg daily. Continue Wellbutrin XL at 300 mg daily. We will see patient in 4 to 6 weeks.  She agrees to contact clinic sooner should there be any worsening or concern prior   Jenne Campus, MD 01/20/2019, 3:16 PM

## 2019-02-10 ENCOUNTER — Telehealth (HOSPITAL_COMMUNITY): Payer: Self-pay | Admitting: *Deleted

## 2019-02-10 NOTE — Telephone Encounter (Signed)
Pt called c/o SE from Zoloft and states that she has tapered herself off as she was experiencing daily diarrhea which could not be correlated to anything else she says. Pt c/o increasing anxiety and decreased sleep now. Pt also asking about FMLA intermittent leave paperwork for doctor. Pt has no upcoming appointments. Writer transferred pt to front desk for appointment.

## 2019-02-11 NOTE — Telephone Encounter (Signed)
Pt called today and is also experiencing shaking, hot flashes, and diziness.

## 2019-02-15 ENCOUNTER — Encounter: Payer: Self-pay | Admitting: Family Medicine

## 2019-02-16 ENCOUNTER — Ambulatory Visit
Admission: RE | Admit: 2019-02-16 | Discharge: 2019-02-16 | Disposition: A | Discharge status: Home / Self Care | Payer: No Typology Code available for payment source | Source: Ambulatory Visit | Attending: Family Medicine | Admitting: Family Medicine

## 2019-02-16 ENCOUNTER — Ambulatory Visit (INDEPENDENT_AMBULATORY_CARE_PROVIDER_SITE_OTHER): Payer: No Typology Code available for payment source | Admitting: Family Medicine

## 2019-02-16 ENCOUNTER — Encounter: Payer: Self-pay | Admitting: Family Medicine

## 2019-02-16 DIAGNOSIS — M25552 Pain in left hip: Secondary | ICD-10-CM

## 2019-02-16 DIAGNOSIS — R42 Dizziness and giddiness: Secondary | ICD-10-CM

## 2019-02-16 DIAGNOSIS — G43809 Other migraine, not intractable, without status migrainosus: Secondary | ICD-10-CM

## 2019-02-16 DIAGNOSIS — M25522 Pain in left elbow: Secondary | ICD-10-CM

## 2019-02-16 DIAGNOSIS — W19XXXA Unspecified fall, initial encounter: Secondary | ICD-10-CM

## 2019-02-16 DIAGNOSIS — S99912A Unspecified injury of left ankle, initial encounter: Secondary | ICD-10-CM

## 2019-02-16 MED ORDER — CYCLOBENZAPRINE HCL 5 MG PO TABS: 5.0000 mg | ORAL_TABLET | Freq: Three times a day (TID) | ORAL | 1 refills | Status: DC | PRN

## 2019-02-16 MED ORDER — TRAMADOL HCL 50 MG PO TABS: 50.0000 mg | ORAL_TABLET | Freq: Three times a day (TID) | ORAL | 0 refills | Status: AC | PRN

## 2019-02-16 MED FILL — traMADol HCL 50 MG TABS: 50 | 5 days supply | Qty: 15 | Fill #0

## 2019-02-16 MED FILL — CYCLOBENZAPRINE 5 MG TABLET: 5 | 10 days supply | Qty: 30 | Fill #0

## 2019-02-16 NOTE — Progress Notes (Signed)
Virtual Visit via Telephone Note  I connected with Rebecca Schroeder on 02/16/19 at 12:32pm by telephone and verified that I am speaking with the correct person using two identifiers.      Pt location: at home   Physician location:  In office, [facility], Vic Blackbird MD     On call: patient and physician   I discussed the limitations, risks, security and privacy concerns of performing an evaluation and management service by telephone and the availability of in person appointments. I also discussed with the patient that there may be a patient responsible charge related to this service. The patient expressed understanding and agreed to proceed.   History of Present Illness: Saturday morning patient was out with both her dogs.  When is on a chain the other does not require one.  She was trying to get the dog that is currently on a chain to come inside she turned towards her left and tripped on something.  She fell onto the concrete onto her left side.  She believes that she rolled her ankle but she also slammed her hip into the concrete as well as her left elbow.  She now has bruising on her left shin she has a large bruise on her left thigh she has bruising along with abrasions on the inner left arm and her elbow.  She has significant pain in her ankle hip region as well as her elbow.  She states that the pain in her hip is going inward towards her bellybutton but she does not see any bruising at this area.  No change in bowel or bladder.  She has been limping some but she is able to walk.  She is also able to use her left upper extremity though it does cause pain.  She does not recall hitting her head there was no known loss of consciousness but her fall was unwitnessed.  She did try taking Tylenol over the weekend that did not help she took ibuprofen today at work with minimal improvement.  Her other concern is that she has been having some dizzy spells along with headaches weakness  over the past 2 weeks.  She initially thought it was coming from her Zoloft so she tapered off of this but then noticed no significant improvement coming off.  She also noted that the headache started when her menstrual cycle came back on which she had not had a menses in quite some time since her last child.  She also admits that she has some stress due to an upcoming court date.  Her headache actually resolved last Wednesday but she still gets in the little nauseous sick feeling.  The other caveat is when she does not eat she gets hypoglycemic symptoms which she has noticed as well during the spells.    [place] Elbow/ hip/pelvis, Ankle  Ultramn/ Flexeril 5mg  - out[aient       Observations/Objective: NAD noted on phone, unable to visualize  Assessment and Plan: status post accidental fall.  Concern for ankle sprain along with contusions noted on the elbow hip thigh.  As she has increased pain in the ankle hip and elbow will obtain x-rays.  This case nares she is deep bruising and this should resolve over the next week or so.  Other possibilities of fracture either one of the sites.  We will have her continue NSAIDs.  I prescribed her tramadol to use for severe pain and Flexeril for muscle spasm which  I expect she will develop as the inflammation goes down.  With regards to the dizzy spells and headaches she does have underlying migraine disorder has not had any problems in the past couple of years however she does have increased stressors right now.  She also came off of her anxiolytic medication.  Her change in hormones with her menstrual cycle restarting and the hypoglycemia are likely all multifactorial in her symptoms.  She does not have any current headache we will hold off on starting anything new and have her monitor her symptoms.  I do recommend she eat regularly in order to keep her glucose.  Follow Up Instructions:    I discussed the assessment and treatment plan with the  patient. The patient was provided an opportunity to ask questions and all were answered. The patient agreed with the plan and demonstrated an understanding of the instructions.   The patient was advised to call back or seek an in-person evaluation if the symptoms worsen or if the condition fails to improve as anticipated.  I provided 15 minutes of non-face-to-face time during this encounter. End Time 12:47pm  Milinda Antis, MD

## 2019-02-20 ENCOUNTER — Telehealth (HOSPITAL_COMMUNITY): Payer: Self-pay

## 2019-02-20 ENCOUNTER — Ambulatory Visit: Payer: No Typology Code available for payment source | Admitting: Family Medicine

## 2019-02-20 NOTE — Telephone Encounter (Signed)
Patient also asked about the Celexa and would like it sent to [facility]. She wants to know if she should still take her Wellbutrin with that? Please advise. Thank you.

## 2019-02-20 NOTE — Telephone Encounter (Signed)
Patient called and stated that she had a panic attack and couldn't catch her breath. She checked her heart rate and stated it was 146, which she said is normal for her. She also stated that she can't shake the feeling in her chest like she normally can. Please review and advise. Thank you.

## 2019-02-23 ENCOUNTER — Other Ambulatory Visit (HOSPITAL_COMMUNITY): Payer: Self-pay | Admitting: Psychiatry

## 2019-02-23 MED ORDER — DULOXETINE HCL 30 MG PO CPEP: 30.0000 mg | ORAL_CAPSULE | Freq: Every day | ORAL | 1 refills | Status: DC

## 2019-02-23 MED FILL — DULoxetine HCL 30 MG CPEP: 30 | 15 days supply | Qty: 15 | Fill #0

## 2019-02-23 NOTE — Telephone Encounter (Incomplete)
I spoke with patient via phone on 1/18 at around 11 AM. Patient reported having had a likely panic attack at work recently consisting of palpitations subjective feelings of shortness of breath and severe anxiety.  Symptoms improved gradually over a period of minutes to an hour.  States that it was following a stressful conversation at work. Currently/today he is doing well, without panic symptoms.  Denies any SI and is future oriented. To review, patient had been on a combination of Wellbutrin XL and Zoloft but recently discontinued Zoloft due to diarrhea.  She is currently taking only Wellbutrin XL at 300 mg daily.  In the past she had also been tried on Prozac which unfortunately caused sexual side effects.  We discussed options to include adding another antidepressant trial to Wellbutrin XL or switching to another medication.  Options reviewed and prefers the latter. We will discontinue Wellbutrin XL, start Cymbalta at 30 mg daily initially.  Side effects have been reviewed. Patient has an appointment and about 3 weeks to follow-up with me but agrees to contact clinic sooner should there be any concern or worsening prior.

## 2019-03-04 ENCOUNTER — Telehealth (HOSPITAL_COMMUNITY): Payer: Self-pay | Admitting: Psychiatry

## 2019-03-04 ENCOUNTER — Encounter: Payer: Self-pay | Admitting: Family Medicine

## 2019-03-04 NOTE — Telephone Encounter (Signed)
D:  Case Manager is assisting Dr. Jama Flavors office with FMLA/Short term disability forms.  Apparently, pt has questions/concerns re: FMLA.  A:  Placed call and left vm for pt to call case manager back re: questions/concerns.

## 2019-03-05 MED ORDER — TOPIRAMATE 50 MG PO TABS: ORAL_TABLET | ORAL | 1 refills | Status: DC

## 2019-03-05 MED FILL — TOPIRAMATE 50 MG TABLET: 50 | 30 days supply | Qty: 30 | Fill #0

## 2019-03-13 ENCOUNTER — Encounter: Payer: Self-pay | Admitting: Psychiatry

## 2019-03-13 NOTE — Progress Notes (Unsigned)
03/13/2019 at 3:45 PM  I contacted patient via phone in response to an email she sent to outpatient staff reporting concerns regarding her FMLA paperwork.  She is concerned about her  FMLA paperwork not reflecting  what she felt had been reviewed/discussed , in particular pertaining to back dating the form to 28 September (to reflect the actual day she stopped working ) and to reflect having some  days available  should she develop increased anxiety or panic symptoms. Currently reports she is doing well, currently at work, denies suicidal ideations. She has an appointment to see me on 2/15. She understands that [facility] paperwork has been resent to outpatient clinic so that  above concerns can be reviewed/addended if appropriate. Sallyanne Havers, MD.

## 2019-03-18 ENCOUNTER — Encounter: Payer: Self-pay | Admitting: Family Medicine

## 2019-03-18 MED FILL — DULoxetine HCL 30 MG CPEP: 30 | 15 days supply | Qty: 15 | Fill #1

## 2019-03-23 ENCOUNTER — Other Ambulatory Visit: Payer: Self-pay

## 2019-03-23 ENCOUNTER — Ambulatory Visit (INDEPENDENT_AMBULATORY_CARE_PROVIDER_SITE_OTHER): Payer: No Typology Code available for payment source | Admitting: Psychiatry

## 2019-03-23 DIAGNOSIS — F3341 Major depressive disorder, recurrent, in partial remission: Secondary | ICD-10-CM

## 2019-03-24 ENCOUNTER — Encounter (HOSPITAL_COMMUNITY): Payer: Self-pay | Admitting: Psychiatry

## 2019-03-24 MED ORDER — DULOXETINE HCL 60 MG PO CPEP: 60.0000 mg | ORAL_CAPSULE | Freq: Every day | ORAL | 1 refills | Status: DC

## 2019-03-24 MED FILL — DULoxetine HCL 60 MG CPEP: 60 | 30 days supply | Qty: 30 | Fill #0 | Status: TO

## 2019-03-24 MED FILL — DULoxetine HCL 60 MG CPEP: 60 | 30 days supply | Qty: 30 | Fill #0
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
## 2019-03-25 ENCOUNTER — Telehealth: Payer: Self-pay | Admitting: Family Medicine

## 2019-03-25 MED ORDER — TOPIRAMATE 100 MG PO TABS: 100.0000 mg | ORAL_TABLET | Freq: Every day | ORAL | 3 refills | Status: DC

## 2019-03-25 MED FILL — TOPIRAMATE 100 MG TABLET: 100 | 30 days supply | Qty: 30 | Fill #0

## 2019-03-25 NOTE — Telephone Encounter (Signed)
[phone] Patient wants to talk about her topiramate and her dosage and how to proceed with this medication

## 2019-03-25 NOTE — Telephone Encounter (Signed)
Call placed to patient.   Reports that she requires prescription for the dose increased per MD. Prescription sent to pharmacy.   Also states that she needs to schedule appointment for F/U on meds. Appointment scheduled.

## 2019-03-31 ENCOUNTER — Telehealth (INDEPENDENT_AMBULATORY_CARE_PROVIDER_SITE_OTHER): Payer: No Typology Code available for payment source | Admitting: Family Medicine

## 2019-03-31 ENCOUNTER — Encounter: Payer: Self-pay | Admitting: Family Medicine

## 2019-03-31 DIAGNOSIS — G43109 Migraine with aura, not intractable, without status migrainosus: Secondary | ICD-10-CM | POA: Diagnosis not present

## 2019-03-31 DIAGNOSIS — F418 Other specified anxiety disorders: Secondary | ICD-10-CM

## 2019-03-31 MED ORDER — RIZATRIPTAN BENZOATE 10 MG PO TABS: 10.0000 mg | ORAL_TABLET | ORAL | 1 refills | Status: DC | PRN

## 2019-03-31 MED FILL — RIZATRIPTAN BENZOATE 10 MG: 10 | 30 days supply | Qty: 10 | Fill #0

## 2019-03-31 NOTE — Progress Notes (Signed)
Virtual Visit via Video Note  I connected with Rebecca Schroeder on 03/31/19 at 12:02pm  by a video enabled telemedicine application and verified that I am speaking with the correct person using two identifiers.      Pt location: at home   Physician location:  In office, [facility], Milinda Antis MD     On call: patient and physician   I discussed the limitations of evaluation and management by telemedicine and the availability of in person appointments. The patient expressed understanding and agreed to proceed.  History of Present Illness: Telehealth visit in the setting of COVID-19.  Following up medication.  She been having significant pain headaches over the past couple of months.  This is similar to when she has them when she was pregnant with her son.  She would have aura of a burning sensation in her nose and then the pain would go up to the forehead region and then around the head.  She is having the same types of headaches.  She was given multiple medications while pregnant different cocktails including Fioricet Flexeril acetaminophen Reglan none of those really helped.  Her headaches improved after delivery but have now come back similar to previous.  She has had increased stressors which also contribute to some of the migraines.  She was seen by her psychiatrist a couple of weeks ago her Cymbalta was increased to 60 mg to help with her anxiety and depression symptoms.  She is still going through custody issues as well and the court case was pushed back into the end of March.   On February 10 increase her Topamax to 100 mg.  This was around the same time that her Cymbalta went to 60 mg.  She did has noticed that there has been an improvement in her headaches they are not as severe do not last as long but she still has almost a daily occurrence.  She also noted some irritability when she initially went up on the medication but is aware that other factors may contribute to  that.  She also had some tingling in her fingertips on the Topamax but that has improved.  Observations/Objective: NAD noted over video.  Normal work of breathing.  Normal affect and mood.  Normal speech.  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  We will continue Topamax at 100 mg we will add on Maxalt as a as needed.  We will try this for the next 3 to 4 weeks if there is no improvement since she has been on other medications in the past and also on treatment for her depression and anxiety I will refer her to neurology to possibly try some of the newer agents. MDD/GAD-use Cymbalta per psychiatry.  Low up at the end of March with them.  Follow Up Instructions: We will follow up  via MyChart to see how the medications are working in about 3 weeks   I discussed the assessment and treatment plan with the patient. The patient was provided an opportunity to ask questions and all were answered. The patient agreed with the plan and demonstrated an understanding of the instructions.   The patient was advised to call back or seek an in-person evaluation if the symptoms worsen or if the condition fails to improve as anticipated.  I provided 13 minutes of non-face-to-face time during this encounter. End Time 12:15pm   [facility],  MD   

## 2019-04-09 MED FILL — ETONOGESTREL-ETHINYL ESTRAD: 0.12-0.015 | 84 days supply | Qty: 3 | Fill #1

## 2019-04-19 ENCOUNTER — Encounter: Payer: Self-pay | Admitting: Family Medicine

## 2019-04-23 ENCOUNTER — Encounter (HOSPITAL_COMMUNITY): Payer: Self-pay | Admitting: Emergency Medicine

## 2019-04-23 ENCOUNTER — Other Ambulatory Visit: Payer: Self-pay

## 2019-04-23 ENCOUNTER — Emergency Department (HOSPITAL_COMMUNITY)
Admission: EM | Admit: 2019-04-23 | Discharge: 2019-04-24 | Disposition: A | Discharge status: Home / Self Care | Payer: No Typology Code available for payment source | Attending: Emergency Medicine | Admitting: Emergency Medicine

## 2019-04-23 DIAGNOSIS — Z8616 Personal history of COVID-19: Secondary | ICD-10-CM | POA: Insufficient documentation

## 2019-04-23 DIAGNOSIS — Z79899 Other long term (current) drug therapy: Secondary | ICD-10-CM | POA: Insufficient documentation

## 2019-04-23 DIAGNOSIS — Z793 Long term (current) use of hormonal contraceptives: Secondary | ICD-10-CM | POA: Insufficient documentation

## 2019-04-23 DIAGNOSIS — R519 Headache, unspecified: Secondary | ICD-10-CM

## 2019-04-23 LAB — CBC WITH DIFFERENTIAL/PLATELET
Abs Immature Granulocytes: 0.02 10*3/uL (ref 0.00–0.07)
Basophils Absolute: 0 10*3/uL (ref 0.0–0.1)
Basophils Relative: 0 %
Eosinophils Absolute: 0 10*3/uL (ref 0.0–0.5)
Eosinophils Relative: 1 %
HCT: 45.2 % (ref 36.0–46.0)
Hemoglobin: 14.7 g/dL (ref 12.0–15.0)
Immature Granulocytes: 0 %
Lymphocytes Relative: 40 %
Lymphs Abs: 2.8 10*3/uL (ref 0.7–4.0)
MCH: 28.7 pg (ref 26.0–34.0)
MCHC: 32.5 g/dL (ref 30.0–36.0)
MCV: 88.3 fL (ref 80.0–100.0)
Monocytes Absolute: 0.4 10*3/uL (ref 0.1–1.0)
Monocytes Relative: 6 %
Neutro Abs: 3.7 10*3/uL (ref 1.7–7.7)
Neutrophils Relative %: 53 %
Platelets: 219 10*3/uL (ref 150–400)
RBC: 5.12 MIL/uL — ABNORMAL HIGH (ref 3.87–5.11)
RDW: 12.6 % (ref 11.5–15.5)
WBC: 7 10*3/uL (ref 4.0–10.5)
nRBC: 0 % (ref 0.0–0.2)

## 2019-04-23 LAB — BASIC METABOLIC PANEL
Anion gap: 10 (ref 5–15)
BUN: 7 mg/dL (ref 6–20)
CO2: 20 mmol/L — ABNORMAL LOW (ref 22–32)
Calcium: 9.1 mg/dL (ref 8.9–10.3)
Chloride: 108 mmol/L (ref 98–111)
Creatinine, Ser: 0.71 mg/dL (ref 0.44–1.00)
GFR calc Af Amer: >60 mL/min (ref >60)
GFR calc non Af Amer: >60 mL/min (ref >60)
Glucose, Bld: 81 mg/dL (ref 70–99)
Potassium: 3.8 mmol/L (ref 3.5–5.1)
Sodium: 138 mmol/L (ref 135–145)

## 2019-04-23 LAB — I-STAT BETA HCG BLOOD, ED (MC, WL, AP ONLY): I-stat hCG, quantitative: <5 m[IU]/mL (ref <5)

## 2019-04-23 MED ORDER — METOCLOPRAMIDE HCL 5 MG/ML IJ SOLN
10.0000 mg | Freq: Once | INTRAMUSCULAR | Status: AC
  Administered 2019-04-23: 10 mg via INTRAVENOUS
  Filled 2019-04-23: qty 2

## 2019-04-23 MED ORDER — KETOROLAC TROMETHAMINE 30 MG/ML IJ SOLN
30.0000 mg | Freq: Once | INTRAMUSCULAR | Status: AC
  Administered 2019-04-23: 22:00:00 30 mg via INTRAVENOUS
  Filled 2019-04-23: qty 1

## 2019-04-23 MED ORDER — DIPHENHYDRAMINE HCL 50 MG/ML IJ SOLN
25.0000 mg | Freq: Once | INTRAMUSCULAR | Status: AC
  Administered 2019-04-23: 25 mg via INTRAVENOUS
  Filled 2019-04-23: qty 1

## 2019-04-23 MED ORDER — SODIUM CHLORIDE 0.9 % IV BOLUS
1000.0000 mL | Freq: Once | INTRAVENOUS | Status: AC
  Administered 2019-04-23: 1000 mL via INTRAVENOUS

## 2019-04-23 NOTE — ED Provider Notes (Addendum)
[facility] EMERGENCY DEPARTMENT Provider Note   CSN: [id number] Arrival date & time: 04/23/19  2044     History Chief Complaint  Patient presents with  . Covid Positive  . Migraine    Rebecca Schroeder is a 29 y.o. female.  The history is provided by the patient and medical records. No language interpreter was used.  Migraine     29 year old female with history of anxiety, migraine, recently diagnosed with COVID-19 5 days ago presenting complaint of headache.  Patient report persistent headache ongoing for the past week.  She described headache as a bandlike sensation across her forehead with a burning sensation to the tip of her nose that radiates towards the right side of her face behind her right eye.  Headache is also associated with recurrent diarrhea, mild light and sound sensitivity.  No report of fever or chills no productive cough.  She does endorse loss of smell for the past few days.  Patient report sent was previously exposed to her father who had COVID-19.  She then subsequently went tested for COVID-19 approximately 5 days ago and the test came back positive.  She admits that she has history of recurrent headache that is similar to this but this headache is much more intense than before.  She tries taking home medication including Flexeril, Tylenol, Advil, Maxalt without adequate relief.  She denies any skin rash.  She endorsed nausea without vomiting.  No shortness of breath.  Past Medical History:  Diagnosis Date  . 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 Back pain    occasional that radiates down leg    Patient Active Problem List   Diagnosis Date Noted  . Migraine with aura 03/31/2019  . Obesity (BMI 30.0-34.9) 12/25/2018  . H/O cesarean section 04/10/2017  . Chronic insomnia 07/25/2016  . Depression with anxiety 05/11/2015  . Subcutaneous nodules 05/11/2015    Past Surgical History:  Procedure Laterality Date  . CESAREAN SECTION    . CESAREAN  SECTION N/A 04/10/2017   Procedure: REPEAT CESAREAN SECTION;  Surgeon: Everlene Farrier, MD;  Location: [address];  Service: Obstetrics;  Laterality: N/A;  Repeat edc 04/17/17 allergy to codeine, hydrocodone, PCN need RNFA.Marland KitchenMarland KitchenMarland KitchenAeronautical engineer RNFA  . TOE SURGERY    . WISDOM TOOTH EXTRACTION       OB History    Gravida  2   Para  2   Term  1   Preterm  1   AB      Living  2     SAB      TAB      Ectopic      Multiple  0   Live Births  2           Family History  Problem Relation Age of Onset  . Depression Mother   . Thyroid disease Mother   . Alcohol abuse Father   . Hypertension Maternal Grandfather   . Diabetes Maternal Grandfather   . Prostate cancer Maternal Grandfather     Social History   Tobacco Use  . Smoking status: Never Smoker  . Smokeless tobacco: Never Used  Substance Use Topics  . Alcohol use: Yes    Alcohol/week: 0.0 standard drinks    Comment: occasionally  . Drug use: No    Home Medications Prior to Admission medications   Medication Sig Start Date End Date Taking? Authorizing Provider  DULoxetine (CYMBALTA) 60 MG capsule Take 1 capsule (60 mg total)  by mouth daily. 03/24/19 03/23/20  [place]obos, Rockey Situ, MD  etonogestrel-ethinyl estradiol (NUVARING) 0.12-0.015 MG/24HR vaginal ring INSERT 1 RING VAGINALLY. LEAVE IN FOR 3 WEEKS AND REMOVE FOR 1 WEEK 01/13/19   [place], Velna Hatchet, MD  rizatriptan (MAXALT) 10 MG tablet Take 1 tablet (10 mg total) by mouth as needed for migraine. May repeat in 2 hours if needed 03/31/19   Salley Scarlet, MD  topiramate (TOPAMAX) 100 MG tablet Take 1 tablet (100 mg total) by mouth at bedtime. migraine. 03/25/19   Salley Scarlet, MD    Allergies    Penicillins, Codeine, Hydrocodone-acetaminophen, and Roxicodone [oxycodone hcl]  Review of Systems   Review of Systems  All other systems reviewed and are negative.   Physical Exam Updated Vital Signs BP 110/65 (BP Location: Right Arm)   Pulse  61   Temp 98.3 F (36.8 C) (Oral)   Resp 16   Ht 5\' 2"  (1.575 m)   Wt 81.6 kg   LMP 04/10/2019 (Exact Date)   SpO2 100%   BMI 32.92 kg/m   Physical Exam Vitals and nursing note reviewed.  Constitutional:      General: She is not in acute distress.    Appearance: She is well-developed.  HENT:     Head: Atraumatic.     Right Ear: Tympanic membrane normal.     Left Ear: Tympanic membrane normal.     Nose: Nose normal.     Mouth/Throat:     Mouth: Mucous membranes are moist.  Eyes:     Extraocular Movements: Extraocular movements intact.     Conjunctiva/sclera: Conjunctivae normal.     Pupils: Pupils are equal, round, and reactive to light.  Cardiovascular:     Rate and Rhythm: Normal rate and regular rhythm.     Pulses: Normal pulses.     Heart sounds: Normal heart sounds.  Pulmonary:     Effort: Pulmonary effort is normal.     Breath sounds: Normal breath sounds. No wheezing, rhonchi or rales.  Abdominal:     Palpations: Abdomen is soft.     Tenderness: There is no abdominal tenderness.  Musculoskeletal:     Cervical back: Normal range of motion and neck supple. No rigidity.  Lymphadenopathy:     Cervical: No cervical adenopathy.  Skin:    Findings: No rash.  Neurological:     Mental Status: She is alert and oriented to person, place, and time.     Comments: Neurologic exam:  Speech clear, pupils equal round reactive to light, extraocular movements intact  Normal peripheral visual fields Cranial nerves III through XII normal including no facial droop Follows commands, moves all extremities x4, normal strength to bilateral upper and lower extremities at all major muscle groups including grip Sensation normal to light touch  No pronator drift Gait not tested      ED Results / Procedures / Treatments   Labs (all labs ordered are listed, but only abnormal results are displayed) Labs Reviewed  BASIC METABOLIC PANEL - Abnormal; Notable for the following  components:      Result Value   CO2 20 (*)    All other components within normal limits  CBC WITH DIFFERENTIAL/PLATELET - Abnormal; Notable for the following components:   RBC 5.12 (*)    All other components within normal limits  I-STAT BETA HCG BLOOD, ED ([facility], [facility], [facility] ONLY)    EKG None  Radiology No results found.  Procedures Procedures (including critical care time)  Medications  Ordered in ED Medications  ketorolac (TORADOL) 30 MG/ML injection 30 mg (30 mg Intravenous Given 04/23/19 2223)  sodium chloride 0.9 % bolus 1,000 mL (1,000 mLs Intravenous New Bag/Given 04/23/19 2222)  metoCLOPramide (REGLAN) injection 10 mg (10 mg Intravenous Given 04/23/19 2223)  diphenhydrAMINE (BENADRYL) injection 25 mg (25 mg Intravenous Given 04/23/19 2223)    ED Course  I have reviewed the triage vital signs and the nursing notes.  Pertinent labs & imaging results that were available during my care of the patient were reviewed by me and considered in my medical decision making (see chart for details).    MDM Rules/Calculators/A&P                      BP 110/65 (BP Location: Right Arm)   Pulse 61   Temp 98.3 F (36.8 C) (Oral)   Resp 16   Ht 5\' 2"  (1.575 m)   Wt 81.6 kg   LMP 04/10/2019 (Exact Date)   SpO2 100%   BMI 32.92 kg/m   Final Clinical Impression(s) / ED Diagnoses Final diagnoses:  Bad headache    Rx / DC Orders ED Discharge Orders    None     9:49 PM Patient with history of recurrent headache here with progressive worsening headache that is similar to her previous headache but more intense.  She does not have any nuchal rigidity concerning for meningitis, no acute onset thunderclap headache concerning for subarachnoid hemorrhage, no focal neuro deficit concerning for stroke or space-occupying lesion.  She is otherwise well-appearing.  Previously test positive for COVID-19 approximately 5 days ago.  She has not been drinking or eating much therefore I suspect a component  of dehydration which may aggravate her headache.  Will provide migraine cocktail along with IV fluid.  11:35 PM Patient report improvement of her headache after receiving migraine cocktail and IV fluid.  At this time she is stable for discharge.   06/10/2019, PA-C 04/23/19 2340    04/25/19, PA-C 04/23/19 2346    04/25/19, MD 04/24/19 [phone]

## 2019-04-23 NOTE — Discharge Instructions (Signed)
Please follow-up closely with your primary care provider for further management of your recurrent headache.  You may benefit from outpatient follow-up with neurologist for further care.  Return if you have any concern.

## 2019-04-23 NOTE — ED Triage Notes (Signed)
  Patient comes in with migraine that has been going on for the past week.  Patient tested positive for COVID on 3/13.  Patient has hx of migraines and tried taking home medications with no relief.  Patient states the migraine wraps around her head and shoots down her neck. Pain 6/10 ATM.  Nausea with no vomiting.  No SOB.

## 2019-04-29 ENCOUNTER — Telehealth (INDEPENDENT_AMBULATORY_CARE_PROVIDER_SITE_OTHER): Payer: No Typology Code available for payment source | Admitting: Nurse Practitioner

## 2019-04-29 DIAGNOSIS — R197 Diarrhea, unspecified: Secondary | ICD-10-CM

## 2019-04-29 DIAGNOSIS — Z09 Encounter for follow-up examination after completed treatment for conditions other than malignant neoplasm: Secondary | ICD-10-CM | POA: Diagnosis not present

## 2019-04-29 DIAGNOSIS — R110 Nausea: Secondary | ICD-10-CM | POA: Diagnosis not present

## 2019-04-29 DIAGNOSIS — U071 COVID-19: Secondary | ICD-10-CM | POA: Diagnosis not present

## 2019-04-29 MED ORDER — ONDANSETRON 4 MG PO TBDP: 4.0000 mg | ORAL_TABLET | Freq: Three times a day (TID) | ORAL | 0 refills | Status: DC | PRN

## 2019-04-29 MED FILL — ONDANSETRON ODT 4 MG TABLET: 4 | 6 days supply | Qty: 20 | Fill #0

## 2019-04-29 NOTE — Progress Notes (Signed)
Telephone Note    I connected withCourtney M. Schroeder on 04/29/2019 at 1127 by telephoneas she could not hear on the video visit and verified that I am speaking with the correct person using two identifiers.  Pt location: at home   Physician location:  In office, [facility], Zavalza Fiscal, FNP-C    On call: patient and physician   I discussed the limitations, risks, security and privacy concerns of performing an evaluation and management service by telephone and the availability of in person appointments. I also discussed with the patient that there may be a patient responsible charge related to this service. The patient expressed understanding and agreed to proceed.   History of Present Illness:  Pt is a 29 year old female who presents for telephone visit r/t dx of COVID on 04/18/2019. 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 She has continued precribed maxalt and topamax for her chronic h/a without relief, the h/a is mild not worse than ever, and not unlike other h/a's, for the nausea she has tried dramamine for nausea that does resolve sxs temporarily, for the diarrhea she has tried nothing. No fever/chills, pain, sob.  Observations/Objective: NAD noted, able to speak in full sentences, no sob or wheezing heard, a/ox3.  Assessment and Plan: Your symptoms of COVID have continued and you should remain in quarantine.  Get plenty of rest and fluids May take over the counter tylenol/ ibuprofen for headaches, Zofran called in for nausea, may take Pepto bismol over the counter as needed for diarrhea Education material avalaible in [place] for n/d/covid  Follow Up Instructions: as needed for no resolved or worsening symptoms   I discussed the assessment and treatment plan with the patient. The patient was provided an opportunity to ask questions and all were answered. The patient agreed  with the plan and demonstrated an understanding of the instructions.  The patient was advised to call back or seek an in-person evaluation if the symptoms worsen or if the condition fails to improve as anticipated.  I provided 13  minutes of non-face-to-face time during this encounter. End Time 11:40   Nardozzi Fiscal, FNP-C

## 2019-05-01 ENCOUNTER — Telehealth: Payer: No Typology Code available for payment source | Admitting: Family Medicine

## 2019-05-04 ENCOUNTER — Ambulatory Visit (INDEPENDENT_AMBULATORY_CARE_PROVIDER_SITE_OTHER): Payer: No Typology Code available for payment source | Admitting: Psychiatry

## 2019-05-04 ENCOUNTER — Encounter (HOSPITAL_COMMUNITY): Payer: Self-pay | Admitting: Psychiatry

## 2019-05-04 ENCOUNTER — Other Ambulatory Visit: Payer: Self-pay

## 2019-05-04 DIAGNOSIS — F3341 Major depressive disorder, recurrent, in partial remission: Secondary | ICD-10-CM | POA: Diagnosis not present

## 2019-05-04 MED ORDER — DULOXETINE HCL 60 MG PO CPEP: 60.0000 mg | ORAL_CAPSULE | Freq: Every day | ORAL | 1 refills | Status: DC

## 2019-05-04 MED ORDER — DULOXETINE HCL 30 MG PO CPEP: 30.0000 mg | ORAL_CAPSULE | Freq: Every day | ORAL | 1 refills | Status: DC

## 2019-05-04 NOTE — Progress Notes (Signed)
BH MD/PA/NP OP Progress Note  05/04/2019 11:23 AM Rebecca Schroeder  MRN:  [id number]  Chief Complaint: Medication management appointment HPI: This visit was conducted via phone due to Covid epidemic precautions.  Patient's identity has been verified using 2 identifiers.  Limitations associate with this mode of communication have been reviewed.  Rebecca Schroeder reports she has continued to experience some anxiety/depression although to a lesser degree than before.  She states she had COVID earlier this month due to which she has been taking some time off work.  She is now feeling better physically, and is scheduled to return to work in about a week.  In the context of being off work she has noted less severe anxiety and less panic symptoms, which had tended to occur at work.  She also describes interactions with her children's father (from whom she is separated) as another anxiety provoking aspect of her life.  She states she mostly interacts with his mother.   She is functioning well in her daily activities, taking care of her young children. She denies suicidal ideations. Reports that in the context of Covid she has had increased headaches (has a history of migraine headaches).  She is prescribed Topamax for headache prophylaxis by her PCP and also takes Maxalt as needed for migraines. She is tolerating Cymbalta well without side effects. We have reviewed side effect profile and also potential for serotonin syndrome in the context of medication combination to include triptan.  She does not endorse symptoms of serotonin syndrome, which have been reviewed.  Visit Diagnosis: MDD, Anxiety consider GAD  Past Psychiatric History:   Past Medical History:  Past Medical History:  Diagnosis Date  . 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 Back pain    occasional that radiates down leg    Past Surgical History:  Procedure Laterality Date  . CESAREAN SECTION    . CESAREAN SECTION N/A 04/10/2017   Procedure: REPEAT  CESAREAN SECTION;  Surgeon: Harold Hedge, MD;  Location: [facility] BIRTHING SUITES;  Service: Obstetrics;  Laterality: N/A;  Repeat edc 04/17/17 allergy to codeine, hydrocodone, PCN need RNFA.Marland KitchenMarland KitchenMarland KitchenPersonnel officer RNFA  . TOE SURGERY    . WISDOM TOOTH EXTRACTION      Family Psychiatric History:   Family History:  Family History  Problem Relation Age of Onset  . Depression Mother   . Thyroid disease Mother   . Alcohol abuse Father   . Hypertension Maternal Grandfather   . Diabetes Maternal Grandfather   . Prostate cancer Maternal Grandfather     Social History:  Social History   Socioeconomic History  . Marital status: Single    Spouse name: Not on file  . Number of children: Not on file  . Years of education: Not on file  . Highest education level: Not on file  Occupational History  . Not on file  Tobacco Use  . Smoking status: Never Smoker  . Smokeless tobacco: Never Used  Substance and Sexual Activity  . Alcohol use: Yes    Alcohol/week: 0.0 standard drinks    Comment: occasionally  . Drug use: No  . Sexual activity: Yes    Birth control/protection: I.U.D.  Other Topics Concern  . Not on file  Social History Narrative  . Not on file   Social Determinants of Health   Financial Resource Strain:   . Difficulty of Paying Living Expenses:   Food Insecurity:   . Worried About Programme researcher, broadcasting/film/video in the Last Year:   .  Ran Out of Food in the Last Year:   Transportation Needs:   . Film/video editor (Medical):   Marland Kitchen Lack of Transportation (Non-Medical):   Physical Activity:   . Days of Exercise per Week:   . Minutes of Exercise per Session:   Stress:   . Feeling of Stress :   Social Connections:   . Frequency of Communication with Friends and Family:   . Frequency of Social Gatherings with Friends and Family:   . Attends Religious Services:   . Active Member of Clubs or Organizations:   . Attends Archivist Meetings:   Marland Kitchen Marital Status:      Allergies:  Allergies  Allergen Reactions  . Penicillins Other (See Comments) and Rash    Unknown Has patient had a PCN reaction causing immediate rash, facial/tongue/throat swelling, SOB or lightheadedness with hypotension: Unknown Has patient had a PCN reaction causing severe rash involving mucus membranes or skin necrosis: Unknown Has patient had a PCN reaction that required hospitalization: Unknown Has patient had a PCN reaction occurring within the last 10 years: Unknown If all of the above answers are "NO", then may proceed with Cephalosporin use. Childhood reaction.  . Codeine Nausea And Vomiting  . Hydrocodone-Acetaminophen Nausea And Vomiting  . Roxicodone [Oxycodone Hcl] Nausea And Vomiting and Other (See Comments)    SEVERE VOMITING----Patient DOES NOT tolerate with anti nausea medications    Metabolic Disorder Labs: No results found for: HGBA1C, MPG No results found for: PROLACTIN Lab Results  Component Value Date   CHOL 159 06/19/2017   TRIG 69 06/19/2017   HDL 50 (L) 06/19/2017   CHOLHDL 3.2 06/19/2017   LDLCALC 93 06/19/2017   Lab Results  Component Value Date   TSH 0.94 08/13/2017   TSH 0.82 07/25/2016    Therapeutic Level Labs: No results found for: LITHIUM No results found for: VALPROATE No components found for:  CBMZ  Current Medications: Current Outpatient Medications  Medication Sig Dispense Refill  . DULoxetine (CYMBALTA) 60 MG capsule Take 1 capsule (60 mg total) by mouth daily. 30 capsule 1  . etonogestrel-ethinyl estradiol (NUVARING) 0.12-0.015 MG/24HR vaginal ring INSERT 1 RING VAGINALLY. LEAVE IN FOR 3 WEEKS AND REMOVE FOR 1 WEEK (Patient taking differently: Place 1 each vaginally every 28 (twenty-eight) days. INSERT 1 RING VAGINALLY. LEAVE IN FOR 3 WEEKS AND REMOVE FOR 1 WEEK) 3 each 9  . ondansetron (ZOFRAN ODT) 4 MG disintegrating tablet Take 1 tablet (4 mg total) by mouth every 8 (eight) hours as needed for nausea or vomiting. 20 tablet  0  . rizatriptan (MAXALT) 10 MG tablet Take 1 tablet (10 mg total) by mouth as needed for migraine. May repeat in 2 hours if needed 10 tablet 1  . topiramate (TOPAMAX) 100 MG tablet Take 1 tablet (100 mg total) by mouth at bedtime. migraine. 30 tablet 3   No current facility-administered medications for this visit.       Psychiatric Specialty Exam: Please take into account limitations in obtaining a full mental status exam in the context of phone communication Review of Systems denies GI side effects on Cymbalta  Last menstrual period 04/10/2019.There is no height or weight on file to calculate BMI.  General Appearance: NA  Eye Contact:  NA  Speech:  Normal Rate  Volume:  Normal  Mood:  Reports improvement but some residual depression  Affect:  Appropriate  Thought Process:  Linear and Descriptions of Associations: Intact  Orientation:  Other:  Fully alert and  attentive  Thought Content: No hallucinations, no delusions   Suicidal Thoughts:  No no suicidal or self-injurious ideations  Homicidal Thoughts:  No  Memory:  Recent and remote grossly intact  Judgement:  Other:  Present  Insight:  Present  Psychomotor Activity:  NA-does not endorse psychomotor agitation  Concentration:  Concentration: Good and Attention Span: Good  Recall:  Good  Fund of Knowledge: Good  Language: Good  Akathisia:  Negative  Handed:  Right  AIMS (if indicated):  Assets:  Communication Skills Desire for Improvement Resilience  ADL's:  Intact  Cognition: WNL  Sleep:  Good   Screenings: GAD-7     Office Visit from 09/12/2018 in [facility] Office Visit from 12/25/2017 in [facility]  Total GAD-7 Score  20  8    PHQ2-9     Office Visit from 12/24/2018 in [facility] Office Visit from 09/12/2018 in [facility] Office Visit from 12/25/2017 in [facility] Office Visit from 07/25/2016 in [facility]  Visit from 05/10/2015 in [facility]  PHQ-2 Total Score  3  3  3   0  2  PHQ-9 Total Score  8  17  12  12  14        Assessment and Plan:  29 year old female, history of depression and anxiety.  Describes partial improvement in mood and anxiety symptoms.  No suicidal ideations.  No significant neurovegetative symptoms reported.  Functioning well in her daily activities/homemaking/taking care of her young children.  She reports anxiety symptoms to include panic attacks have tended to occur at work.  She is currently off work due to having contracted Covid earlier this month.  She states she is feeling better and is planning on returning to work next week.  Also reports she is currently seeking another job. We discussed options.  Thus far she is tolerating Cymbalta well without significant side effects.  We will increase Cymbalta further to 90 mg daily  ( 30 mg + 60 mgr tablets) to address lingering/residual symptoms as described above.  Have reviewed side effect profile, to include potential risk of serotonin syndrome as a serious side effect. We will see in 4 weeks, she agrees to contact clinic sooner should to be any worsening prior.    , MD 05/04/2019, 11:23 AM

## 2019-06-09 ENCOUNTER — Ambulatory Visit (HOSPITAL_COMMUNITY): Payer: No Typology Code available for payment source | Admitting: Psychiatry

## 2019-06-15 ENCOUNTER — Ambulatory Visit (HOSPITAL_COMMUNITY): Payer: No Typology Code available for payment source | Admitting: Psychiatry

## 2019-06-23 ENCOUNTER — Telehealth (HOSPITAL_COMMUNITY): Payer: Self-pay

## 2019-06-23 NOTE — Telephone Encounter (Signed)
Patient called and stated that she had to withdraw from classes in November due to issues she was having and medication adjustments being made. She stated that she can't re-enroll at this time because the school wants a letter regarding her issues she was having in November before they will allow her to re-enroll. Please review and advise. Thank you.

## 2019-06-25 MED FILL — ETONOGESTREL-ETHINYL ESTRAD: 0.12-0.015 | 84 days supply | Qty: 3 | Fill #2

## 2019-07-03 ENCOUNTER — Encounter (HOSPITAL_COMMUNITY): Payer: Self-pay | Admitting: *Deleted

## 2019-07-07 ENCOUNTER — Ambulatory Visit (HOSPITAL_COMMUNITY): Payer: No Typology Code available for payment source | Admitting: Psychiatry

## 2019-07-10 ENCOUNTER — Encounter (HOSPITAL_COMMUNITY): Payer: Self-pay

## 2019-07-13 ENCOUNTER — Other Ambulatory Visit: Payer: Self-pay

## 2019-07-13 ENCOUNTER — Telehealth (INDEPENDENT_AMBULATORY_CARE_PROVIDER_SITE_OTHER): Payer: No Typology Code available for payment source | Admitting: Psychiatry

## 2019-07-13 DIAGNOSIS — F332 Major depressive disorder, recurrent severe without psychotic features: Secondary | ICD-10-CM

## 2019-07-13 DIAGNOSIS — F411 Generalized anxiety disorder: Secondary | ICD-10-CM | POA: Diagnosis not present

## 2019-07-13 NOTE — Progress Notes (Signed)
BH MD/PA/NP OP Progress Note  07/13/2019 2:43 PM Rebecca Schroeder  MRN:  [id number]  Chief Complaint: Medication management appointment HPI: This visit was conducted via phone due to Covid epidemic precautions.  Patient's identity has been verified using 2 identifiers.  Limitations associate with this mode of communication have been reviewed.  Patient reports that she has been doing better. She attributes this in part to having a new job that she finds more rewarding and less stressful than her previous one and state she has not experienced increased anxiety or panic symptoms at work lately.  ( she had described increased anxiety and some panic symptoms intermittently during her prior job ) She also states she feels she has been able to spend more quality time with her two young children who are ages 90,2 y old . 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 She reports she requires a letter from me certifying that she is a patient under my care currently and was at the time she suspended her studies as part of the paperwork required to resume her studies . Of note, patient reports that she stopped her medications ( Cymbalta) back in April and that she is currently not on any psychiatric medications . She states that she felt Cymbalta was causing her to feel overmedicated and subjectively sluggish and that she does feel these have improved after she stopped the medication . States " I have actually been feeling better lately".  Denies significant neuro-vegetative symptoms of depression or anhedonia at this time.  Visit Diagnosis: MDD, Anxiety consider GAD  Past Psychiatric History:   Past Medical History:  Past Medical History:  Diagnosis Date  . 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 Back pain    occasional that radiates down leg    Past Surgical History:   Procedure Laterality Date  . CESAREAN SECTION    . CESAREAN SECTION N/A 04/10/2017   Procedure: REPEAT CESAREAN SECTION;  Surgeon: Harold Hedge, MD;  Location: [facility] BIRTHING SUITES;  Service: Obstetrics;  Laterality: N/A;  Repeat edc 04/17/17 allergy to codeine, hydrocodone, PCN need RNFA.Marland KitchenMarland KitchenMarland KitchenPersonnel officer RNFA  . TOE SURGERY    . WISDOM TOOTH EXTRACTION      Family Psychiatric History:   Family History:  Family History  Problem Relation Age of Onset  . Depression Mother   . Thyroid disease Mother   . Alcohol abuse Father   . Hypertension Maternal Grandfather   . Diabetes Maternal Grandfather   . Prostate cancer Maternal Grandfather     Social History:  Social History   Socioeconomic History  . Marital status: Single    Spouse name: Not on file  . Number of children: Not on file  . Years of education: Not on file  . Highest education level: Not on file  Occupational History  . Not on file  Tobacco Use  . Smoking status: Never Smoker  . Smokeless tobacco: Never Used  Substance and Sexual Activity  . Alcohol use: Yes    Alcohol/week: 0.0 standard drinks    Comment: occasionally  . Drug use: No  . Sexual activity: Yes    Birth control/protection: I.U.D.  Other Topics Concern  . Not on file  Social History Narrative  . Not on file   Social Determinants of Health   Financial Resource Strain:   .  Difficulty of Paying Living Expenses:   Food Insecurity:   . Worried About Programme researcher, broadcasting/film/video in the Last Year:   . Barista in the Last Year:   Transportation Needs:   . Freight forwarder (Medical):   Marland Kitchen Lack of Transportation (Non-Medical):   Physical Activity:   . Days of Exercise per Week:   . Minutes of Exercise per Session:   Stress:   . Feeling of Stress :   Social Connections:   . Frequency of Communication with Friends and Family:   . Frequency of Social Gatherings with Friends and Family:   . Attends Religious Services:   . Active  Member of Clubs or Organizations:   . Attends Banker Meetings:   Marland Kitchen Marital Status:     Allergies:  Allergies  Allergen Reactions  . Penicillins Other (See Comments) and Rash    Unknown Has patient had a PCN reaction causing immediate rash, facial/tongue/throat swelling, SOB or lightheadedness with hypotension: Unknown Has patient had a PCN reaction causing severe rash involving mucus membranes or skin necrosis: Unknown Has patient had a PCN reaction that required hospitalization: Unknown Has patient had a PCN reaction occurring within the last 10 years: Unknown If all of the above answers are "NO", then may proceed with Cephalosporin use. Childhood reaction.  . Codeine Nausea And Vomiting  . Hydrocodone-Acetaminophen Nausea And Vomiting  . Roxicodone [Oxycodone Hcl] Nausea And Vomiting and Other (See Comments)    SEVERE VOMITING----Patient DOES NOT tolerate with anti nausea medications    Metabolic Disorder Labs: No results found for: HGBA1C, MPG No results found for: PROLACTIN Lab Results  Component Value Date   CHOL 159 06/19/2017   TRIG 69 06/19/2017   HDL 50 (L) 06/19/2017   CHOLHDL 3.2 06/19/2017   LDLCALC 93 06/19/2017   Lab Results  Component Value Date   TSH 0.94 08/13/2017   TSH 0.82 07/25/2016    Therapeutic Level Labs: No results found for: LITHIUM No results found for: VALPROATE No components found for:  CBMZ  Current Medications: Current Outpatient Medications  Medication Sig Dispense Refill  . DULoxetine (CYMBALTA) 30 MG capsule Take 1 capsule (30 mg total) by mouth daily. 30 capsule 1  . DULoxetine (CYMBALTA) 60 MG capsule Take 1 capsule (60 mg total) by mouth daily. 30 capsule 1  . etonogestrel-ethinyl estradiol (NUVARING) 0.12-0.015 MG/24HR vaginal ring INSERT 1 RING VAGINALLY. LEAVE IN FOR 3 WEEKS AND REMOVE FOR 1 WEEK (Patient taking differently: Place 1 each vaginally every 28 (twenty-eight) days. INSERT 1 RING VAGINALLY. LEAVE IN FOR  3 WEEKS AND REMOVE FOR 1 WEEK) 3 each 9  . ondansetron (ZOFRAN ODT) 4 MG disintegrating tablet Take 1 tablet (4 mg total) by mouth every 8 (eight) hours as needed for nausea or vomiting. 20 tablet 0  . rizatriptan (MAXALT) 10 MG tablet Take 1 tablet (10 mg total) by mouth as needed for migraine. May repeat in 2 hours if needed 10 tablet 1  . topiramate (TOPAMAX) 100 MG tablet Take 1 tablet (100 mg total) by mouth at bedtime. migraine. 30 tablet 3   No current facility-administered medications for this visit.       Psychiatric Specialty Exam: Please take into account limitations in obtaining a full mental status exam in the context of phone communication Review of Systems    There were no vitals taken for this visit.There is no height or weight on file to calculate BMI.  General Appearance: NA  Eye Contact:  NA  Speech:  Normal Rate  Volume:  Normal  Mood:  reports her mood has improved and states she is feeling " better"  Affect:  Appropriate/ full in range   Thought Process:  Linear and Descriptions of Associations: Intact  Orientation:  Other:  Fully alert and attentive  Thought Content: No hallucinations, no delusions   Suicidal Thoughts:  No no suicidal or self-injurious ideations  Homicidal Thoughts:  No  Memory:  Recent and remote grossly intact  Judgement:  Other:  Present  Insight:  Present  Psychomotor Activity:  NA-does not endorse psychomotor agitation  Concentration:  Concentration: Good and Attention Span: Good  Recall:  Good  Fund of Knowledge: Good  Language: Good  Akathisia:  Negative  Handed:  Right  AIMS (if indicated):  Assets:  Communication Skills Desire for Improvement Resilience  ADL's:  Intact  Cognition: WNL  Sleep:  Good   Screenings: GAD-7     Office Visit from 09/12/2018 in [facility] Office Visit from 12/25/2017 in [facility]  Total GAD-7 Score  20  8    PHQ2-9     Office Visit from 12/24/2018 in [facility] Office Visit from 09/12/2018 in [facility] Office Visit from 12/25/2017 in [facility] Office Visit from 07/25/2016 in [facility] Office Visit from 05/10/2015 in [place] [facility]  PHQ-2 Total Score  3  3  3   0  2  PHQ-9 Total Score  8  17  12  12  14        Assessment and Plan:  29 year old female, history of depression and anxiety.    Currently patient reports she is feeling better , with improved anxiety and decreased depression. At this time presents euthymic and future oriented, stating she is planning on resuming Master's Program soon. Attributes improvement in part to a new job she is happier with, and states she has had no further panic symptoms at work.  Of note, she reports she stopped Cymbalta as she felt it was causing her to feel subjectively sluggish and overmedicated. Last took Cymbalta in April/2021. She is now off psychiatric medications. We reviewed possible  increased risk of anxiety, mood symptoms off antidepressant medications ( history of prior depressive episodes, history of Panic Attacks, prior history of MDD) . For now patient prefers to remain off medications , but will contact clinic if any worsening or exacerbation of symptoms and we will make a follow up appt  In 3-4 weeks for further monitoring . Also encouraged to continue individual psychotherapy. No medications prescribed at this time. See above . As above, patient requesting letter as part of documents she needs to resume her studies .    Jenne Campus, MD 07/13/2019, 2:43 PM

## 2019-08-07 MED FILL — ONDANSETRON ODT 4 MG TABLET: 4 | 6 days supply | Qty: 20 | Fill #0

## 2019-08-18 ENCOUNTER — Telehealth (INDEPENDENT_AMBULATORY_CARE_PROVIDER_SITE_OTHER): Payer: No Typology Code available for payment source | Admitting: Psychiatry

## 2019-08-18 ENCOUNTER — Other Ambulatory Visit: Payer: Self-pay

## 2019-08-18 DIAGNOSIS — F332 Major depressive disorder, recurrent severe without psychotic features: Secondary | ICD-10-CM

## 2019-08-18 DIAGNOSIS — F411 Generalized anxiety disorder: Secondary | ICD-10-CM | POA: Diagnosis not present

## 2019-08-18 NOTE — Progress Notes (Signed)
BH MD/PA/NP OP Progress Note  08/18/2019 4:29 PM Rebecca Schroeder  MRN:  [id number]  Chief Complaint: Medication management appointment HPI: This visit was conducted via phone due to Covid epidemic precautions.  Patient's identity has been verified using 2 identifiers.  Limitations associate with this mode of communication have been reviewed.  Patient reports that in general she has been doing "all right".  She states she is functioning well in her daily activities which include employment and being the primary caretaker of her 2 young children.  She is also studying/enrolled in a master's program. She does describe some lingering anxiety and depression.  For example states that her children are going on patient with their father ( patient is separated) next week which will be the first time patient will be away from her children for more than a couple of days.  She states "I know it will be all right", but feels anxious about not being with them.  She also reports some social isolation, states she spends most time either at work or at home with her children, does not socialize much. She is not endorsing significant neurovegetative symptoms of depression, no suicidal ideations and is future oriented, for example plans to change University venue and continue her Master's Program at another institution. As noted in prior note patient is not currently taking any psychiatric medication.  She states she is not taking any standing medications .  We reviewed.  She states that she had been on several psychiatric medications over time (Cymbalta, Wellbutrin XL, Lexapro) and states that although they did have some positive effect they also caused some side effects ( such as sexual side effects, causing her to feel emotionally blunted ) and notes that she is doing relatively well without medications at this time. She does express interest in psychotherapy.  Visit Diagnosis: MDD, Anxiety consider GAD  Past  Psychiatric History:   Past Medical History:  Past Medical History:  Diagnosis Date  . 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 Back pain    occasional that radiates down leg    Past Surgical History:  Procedure Laterality Date  . CESAREAN SECTION    . CESAREAN SECTION N/A 04/10/2017   Procedure: REPEAT CESAREAN SECTION;  Surgeon: Harold Hedge, MD;  Location: [facility] BIRTHING SUITES;  Service: Obstetrics;  Laterality: N/A;  Repeat edc 04/17/17 allergy to codeine, hydrocodone, PCN need RNFA.Marland KitchenMarland KitchenMarland KitchenPersonnel officer RNFA  . TOE SURGERY    . WISDOM TOOTH EXTRACTION      Family Psychiatric History:   Family History:  Family History  Problem Relation Age of Onset  . Depression Mother   . Thyroid disease Mother   . Alcohol abuse Father   . Hypertension Maternal Grandfather   . Diabetes Maternal Grandfather   . Prostate cancer Maternal Grandfather     Social History:  Social History   Socioeconomic History  . Marital status: Single    Spouse name: Not on file  . Number of children: Not on file  . Years of education: Not on file  . Highest education level: Not on file  Occupational History  . Not on file  Tobacco Use  . Smoking status: Never Smoker  . Smokeless tobacco: Never Used  Substance and Sexual Activity  . Alcohol use: Yes    Alcohol/week: 0.0 standard drinks    Comment: occasionally  . Drug use: No  . Sexual activity: Yes    Birth control/protection: I.U.D.  Other Topics Concern  . Not  on file  Social History Narrative  . Not on file   Social Determinants of Health   Financial Resource Strain:   . Difficulty of Paying Living Expenses:   Food Insecurity:   . Worried About Programme researcher, broadcasting/film/video in the Last Year:   . Barista in the Last Year:   Transportation Needs:   . Freight forwarder (Medical):   Marland Kitchen Lack of Transportation (Non-Medical):   Physical Activity:   . Days of Exercise per Week:   . Minutes of Exercise per Session:   Stress:   . Feeling of  Stress :   Social Connections:   . Frequency of Communication with Friends and Family:   . Frequency of Social Gatherings with Friends and Family:   . Attends Religious Services:   . Active Member of Clubs or Organizations:   . Attends Banker Meetings:   Marland Kitchen Marital Status:     Allergies:  Allergies  Allergen Reactions  . Penicillins Other (See Comments) and Rash    Unknown Has patient had a PCN reaction causing immediate rash, facial/tongue/throat swelling, SOB or lightheadedness with hypotension: Unknown Has patient had a PCN reaction causing severe rash involving mucus membranes or skin necrosis: Unknown Has patient had a PCN reaction that required hospitalization: Unknown Has patient had a PCN reaction occurring within the last 10 years: Unknown If all of the above answers are "NO", then may proceed with Cephalosporin use. Childhood reaction.  . Codeine Nausea And Vomiting  . Hydrocodone-Acetaminophen Nausea And Vomiting  . Roxicodone [Oxycodone Hcl] Nausea And Vomiting and Other (See Comments)    SEVERE VOMITING----Patient DOES NOT tolerate with anti nausea medications    Metabolic Disorder Labs: No results found for: HGBA1C, MPG No results found for: PROLACTIN Lab Results  Component Value Date   CHOL 159 06/19/2017   TRIG 69 06/19/2017   HDL 50 (L) 06/19/2017   CHOLHDL 3.2 06/19/2017   LDLCALC 93 06/19/2017   Lab Results  Component Value Date   TSH 0.94 08/13/2017   TSH 0.82 07/25/2016    Therapeutic Level Labs: No results found for: LITHIUM No results found for: VALPROATE No components found for:  CBMZ  Current Medications: Current Outpatient Medications  Medication Sig Dispense Refill  . etonogestrel-ethinyl estradiol (NUVARING) 0.12-0.015 MG/24HR vaginal ring INSERT 1 RING VAGINALLY. LEAVE IN FOR 3 WEEKS AND REMOVE FOR 1 WEEK (Patient taking differently: Place 1 each vaginally every 28 (twenty-eight) days. INSERT 1 RING VAGINALLY. LEAVE IN FOR  3 WEEKS AND REMOVE FOR 1 WEEK) 3 each 9  . ondansetron (ZOFRAN ODT) 4 MG disintegrating tablet Take 1 tablet (4 mg total) by mouth every 8 (eight) hours as needed for nausea or vomiting. 20 tablet 0  . rizatriptan (MAXALT) 10 MG tablet Take 1 tablet (10 mg total) by mouth as needed for migraine. May repeat in 2 hours if needed 10 tablet 1  . topiramate (TOPAMAX) 100 MG tablet Take 1 tablet (100 mg total) by mouth at bedtime. migraine. 30 tablet 3   No current facility-administered medications for this visit.       Psychiatric Specialty Exam: Please take into account limitations in obtaining a full mental status exam in the context of phone communication Review of Systems    There were no vitals taken for this visit.There is no height or weight on file to calculate BMI.  General Appearance: NA  Eye Contact:  NA  Speech:  Normal Rate  Volume:  Normal  Mood:  Generally stable/improved.  Describes some mild depression at times  Affect:  Appropriate/ full in range   Thought Process:  Linear and Descriptions of Associations: Intact  Orientation:  Other:  Fully alert and attentive  Thought Content: No hallucinations, no delusions   Suicidal Thoughts:  No no suicidal or self-injurious ideations/presents future oriented  Homicidal Thoughts:  No  Memory:  Recent and remote grossly intact  Judgement:  Other:  Present  Insight:  Present  Psychomotor Activity:  NA-does not endorse psychomotor agitation  Concentration:  Concentration: Good and Attention Span: Good  Recall:  Good  Fund of Knowledge: Good  Language: Good  Akathisia:  Negative  Handed:  Right  AIMS (if indicated):  Assets:  Communication Skills Desire for Improvement Resilience  ADL's:  Intact  Cognition: WNL  Sleep:  Good   Screenings: GAD-7     Office Visit from 09/12/2018 in [facility] Office Visit from 12/25/2017 in [facility]  Total GAD-7 Score 20 8    PHQ2-9     Office Visit  from 12/24/2018 in [facility] Office Visit from 09/12/2018 in [facility] Office Visit from 12/25/2017 in [facility] Office Visit from 07/25/2016 in [facility] Office Visit from 05/10/2015 in [facility]  PHQ-2 Total Score 3 3 3  0 2  PHQ-9 Total Score 8 17 12 12 14        Assessment and Plan:  29 year old female, history of depression and anxiety.    Ms. Jhaveri reports she is doing well.  She states she is functioning well in her daily activities, to include work, masters level studies, and taking care of her young children.  She does endorse some lingering depression and anxiety and states she is feeling vaguely apprehensive about her children going on vacation with their father next week as it will be the first time she is separated from them.  She denies significant neurovegetative symptoms, denies suicidal ideations and presents future oriented. As noted, patient has opted to stop all her psychiatric medications (most recently Cymbalta, which she has been off of for several weeks).  She states that although antidepressants have worked partially they have also caused side effects and currently prefers to continue non-psychopharmacological treatment options, such as psychotherapy.  She has been seeing a therapist but states she felt it was not a good match and she was not benefiting.  She is interested in continuing psychotherapy with somebody else and I have asked front desk staff to provide her with referrals for individual psychotherapy. We have again reviewed the potential increased risk of worsening symptoms/decompensation of mood disorder off psychiatric medications.  She expresses understanding.  She states she will  contact clinic to restart psychiatric outpatient follow-ups if she feels any worsening or concern.  She states she would like to see how she does with psychotherapy for 2 to 3 months before making a  decision of whether to return to medication management as well. No standing appointment to see writer at this time No medications prescribed by me at this time Patient reports she is not currently taking any psychiatric medications.    34, MD 08/18/2019, 4:29 PM

## 2019-09-24 NOTE — Progress Notes (Signed)
Addendum to 07/13/2019 Visit note   Location of Parties   Patient- home ( phone based communication) Physician - [facility]  Sallyanne Havers, MD

## 2019-09-28 MED FILL — ETONOGESTREL-ETHINYL ESTRAD: 0.12-0.015 | 84 days supply | Qty: 3 | Fill #3

## 2019-11-10 ENCOUNTER — Other Ambulatory Visit: Payer: Self-pay | Admitting: *Deleted

## 2019-11-10 ENCOUNTER — Other Ambulatory Visit: Payer: No Typology Code available for payment source

## 2019-11-10 DIAGNOSIS — Z20822 Contact with and (suspected) exposure to covid-19: Secondary | ICD-10-CM

## 2019-11-11 LAB — NOVEL CORONAVIRUS, NAA: SARS-CoV-2, NAA: NOT DETECTED

## 2019-11-11 LAB — SARS-COV-2, NAA 2 DAY TAT

## 2019-12-11 MED FILL — ETONOGESTREL-ETHINYL ESTRAD: 0.12-0.015 | 84 days supply | Qty: 3 | Fill #4

## 2019-12-15 ENCOUNTER — Ambulatory Visit: Payer: No Typology Code available for payment source | Admitting: Family Medicine

## 2019-12-22 MED FILL — ETONOGESTREL-ETHINYL ESTRAD: 0.12-0.015 | 84 days supply | Qty: 3 | Fill #4

## 2020-02-12 ENCOUNTER — Encounter: Payer: No Typology Code available for payment source | Admitting: Family Medicine

## 2020-02-17 ENCOUNTER — Other Ambulatory Visit (HOSPITAL_COMMUNITY): Payer: Self-pay | Admitting: Obstetrics and Gynecology

## 2020-02-29 MED FILL — ETONOGESTREL-ETHINYL ESTRAD: 0.12-0.015 | 84 days supply | Qty: 3 | Fill #0

## 2020-03-15 MED FILL — ETONOGESTREL-ETHINYL ESTRAD: 0.12-0.015 | 84 days supply | Qty: 3 | Fill #0

## 2020-03-30 ENCOUNTER — Encounter: Payer: Self-pay | Admitting: Nurse Practitioner

## 2020-03-30 ENCOUNTER — Other Ambulatory Visit: Payer: Self-pay

## 2020-03-30 ENCOUNTER — Ambulatory Visit (INDEPENDENT_AMBULATORY_CARE_PROVIDER_SITE_OTHER): Payer: No Typology Code available for payment source | Admitting: Nurse Practitioner

## 2020-03-30 VITALS — BP 124/80 | HR 76 | Temp 98.2°F | Ht 62.0 in | Wt 194.2 lb

## 2020-03-30 DIAGNOSIS — R5383 Other fatigue: Secondary | ICD-10-CM | POA: Diagnosis not present

## 2020-03-30 DIAGNOSIS — Z0001 Encounter for general adult medical examination with abnormal findings: Secondary | ICD-10-CM

## 2020-03-30 DIAGNOSIS — Z6835 Body mass index (BMI) 35.0-35.9, adult: Secondary | ICD-10-CM | POA: Diagnosis not present

## 2020-03-30 DIAGNOSIS — Z0000 Encounter for general adult medical examination without abnormal findings: Secondary | ICD-10-CM

## 2020-03-30 NOTE — Progress Notes (Signed)
BP 124/80   Pulse 76   Temp 98.2 F (36.8 C)   Ht 5\' 2"  (1.575 m)   Wt 194 lb 3.2 oz (88.1 kg)   SpO2 97%   BMI 35.52 kg/m    Subjective:    Patient ID: , female    DOB: 09-13-1990, 30 y.o.   MRN: [id number]  HPI: Rebecca Schroeder is a 30 y.o. female presenting on 03/30/2020 for comprehensive medical examination. Current medical complaints include:   MOOD Reports history of panic attacks and PTSD related to relationship with ex-husband.  She saw multiple different counselors and never felt like it "went anywhere."  Has developed ways to cope with her anxiety and stress now including deep breathing.  Feels that overall, her mood is controlled for the most part.  Reports trying multiple different medications and they "did not change anything."  Felt the medication made her "monotone."  She is now able to work herself thorugh her panic attacks and her panic attacks are not nearly as frequent. Mood status: controlled Satisfied with current treatment?: yes Symptom severity: mild  Duration of current treatment : not currently taking medication Side effects: n/a Psychotherapy/counseling: has tried in the past Previous psychiatric medications: Depressed mood: no Anxious mood: yes Anhedonia: yes Significant weight loss or gain: yes; weight gain Insomnia: no Fatigue: yes Feelings of worthlessness or guilt: no Impaired concentration/indecisiveness: yes Suicidal ideations: no Hopelessness: no Crying spells: no Depression screen [facility] 2/9 03/30/2020 12/24/2018 09/12/2018 12/25/2017 12/25/2017  Decreased Interest 3 2 3 2 2   Down, Depressed, Hopeless 0 1 - 1 1  PHQ - 2 Score 3 3 3 3 3   Altered sleeping 3 2 3  - 1  Tired, decreased energy 3 1 3  - 3  Change in appetite 0 0 3 - 0  Feeling bad or failure about yourself  0 0 2 - 2  Trouble concentrating 3 2 3  - 3  Moving slowly or fidgety/restless 0 0 0 - 0  Suicidal thoughts 0 0 0 - 0  PHQ-9 Score 12 8 17  - 12  Difficult  doing work/chores Somewhat difficult Very difficult Extremely dIfficult - -    GAD 7 : Generalized Anxiety Score 03/30/2020 09/12/2018 12/25/2017  Nervous, Anxious, on Edge 1 3 1   Control/stop worrying 1 3 1   Worry too much - different things 0 3 0  Trouble relaxing 3 3 1   Restless 0 3 0  Easily annoyed or irritable 0 2 2  Afraid - awful might happen 0 3 3  Total GAD 7 Score 5 20 8   Anxiety Difficulty Somewhat difficult Extremely difficult -   FATIGUE Reports she is tired all of the time.  She listens to people but cannot hear or comprehend people.   Duration:  chronic Severity: moderate  Onset: sudden Context when symptoms started:  unknown Symptoms improve with rest: no  Depressive symptoms: yes Stress/anxiety: yes Insomnia: no  Snoring: no Observed apnea by bed partner: no Daytime hypersomnolence:yes Wakes feeling refreshed: no History of sleep study: no Dysnea on exertion:  yes; also has started gaining weight  Orthopnea/PND: no Chest pain: no Chronic cough: no Lower extremity edema: no Arthralgias:no Myalgias: no Weakness: no Rash: no  She currently lives with: 2 children LMP: uses NuvaRing   Depression Screen done today and results listed below:  Depression screen [facility] 2/9 03/30/2020 12/24/2018 09/12/2018 12/25/2017 12/25/2017  Decreased Interest 3 2 3 2 2   Down, Depressed, Hopeless 0 1 - 1 1  PHQ - 2 Score 3 3 3 3 3   Altered sleeping 3 2 3  - 1  Tired, decreased energy 3 1 3  - 3  Change in appetite 0 0 3 - 0  Feeling bad or failure about yourself  0 0 2 - 2  Trouble concentrating 3 2 3  - 3  Moving slowly or fidgety/restless 0 0 0 - 0  Suicidal thoughts 0 0 0 - 0  PHQ-9 Score 12 8 17  - 12  Difficult doing work/chores Somewhat difficult Very difficult Extremely dIfficult - -    The patient does not have a history of falls. I did not complete a risk assessment for falls. A plan of care for falls was not documented.   Past Medical History:  Past Medical  History:  Diagnosis Date  . Abnormality in fetal heart rate and rhythm complicating labor and delivery 07/30/2014  . 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 Back pain    occasional that radiates down leg  . Non-reassuring electronic fetal monitoring tracing 07/30/2014  . Preterm labor 05/03/2014  . Single umbilical artery 04/15/2014  . Supervision of normal pregnancy 06/18/2014    Surgical History:  Past Surgical History:  Procedure Laterality Date  . CESAREAN SECTION    . CESAREAN SECTION N/A 04/10/2017   Procedure: REPEAT CESAREAN SECTION;  Surgeon: 08/01/2014, MD;  Location: [facility] SU[facility];  Service: Obstetrics;  Laterality: N/A;  Repeat edc 04/17/17 allergy to codeine, hydrocodone, PCN need RNFA.[id number]2019[facility]  . TOE SURGERY    . WISDOM TOOTH EXTRACTION      Medications:  Current Outpatient Medications on File Prior to Visit  Medication Sig  . etonogestrel-ethinyl estradiol (NUVARING) 0.12-0.015 MG/24HR vaginal ring INSERT 1 RING VAGINALLY. LEAVE IN FOR 3 WEEKS AND REMOVE FOR 1 WEEK (Patient taking differently: Place 1 each vaginally every 28 (twenty-eight) days. INSERT 1 RING VAGINALLY. LEAVE IN FOR 3 WEEKS AND REMOVE FOR 1 WEEK)   No current facility-administered medications on file prior to visit.    Allergies:  Allergies  Allergen Reactions  . Hydrocodone Nausea And Vomiting  . Penicillins Other (See Comments) and Rash    Unknown Has patient had a PCN reaction causing immediate rash, facial/tongue/throat swelling, SOB or lightheadedness with hypotension: Unknown Has patient had a PCN reaction causing severe rash involving mucus membranes or skin necrosis: Unknown Has patient had a PCN reaction that required hospitalization: Unknown Has patient had a PCN reaction occurring within the last 10 years: Unknown If all of the above answers are "NO", then may proceed with Cephalosporin use. Childhood reaction.  . Codeine Nausea And Vomiting  . Hydrocodone-Acetaminophen  Nausea And Vomiting  . Roxicodone [Oxycodone Hcl] Nausea And Vomiting and Other (See Comments)    SEVERE VOMITING----Patient DOES NOT tolerate with anti nausea medications    Social History:  Social History   Socioeconomic History  . Marital status: Single    Spouse name: Not on file  . Number of children: Not on file  . Years of education: Not on file  . Highest education level: Not on file  Occupational History  . Not on file  Tobacco Use  . Smoking status: Never Smoker  . Smokeless tobacco: Never Used  Substance and Sexual Activity  . Alcohol use: Yes    Alcohol/week: 0.0 standard drinks    Comment: occasionally  . Drug use: No  . Sexual activity: Yes    Birth control/protection: I.U.D.  Other Topics Concern  . Not on  file  Social History Narrative  . Not on file   Social Determinants of Health   Financial Resource Strain: Not on file  Food Insecurity: Not on file  Transportation Needs: Not on file  Physical Activity: Not on file  Stress: Not on file  Social Connections: Not on file  Intimate Partner Violence: Not on file   Social History   Tobacco Use  Smoking Status Never Smoker  Smokeless Tobacco Never Used   Social History   Substance and Sexual Activity  Alcohol Use Yes  . Alcohol/week: 0.0 standard drinks   Comment: occasionally    Family History:  Family History  Problem Relation Age of Onset  . Depression Mother   . Thyroid disease Mother   . Alcohol abuse Father   . Hypertension Maternal Grandfather   . Diabetes Maternal Grandfather   . Prostate cancer Maternal Grandfather     Past medical history, surgical history, medications, allergies, family history and social history reviewed with patient today and changes made to appropriate areas of the chart.   ROS     Objective:    BP 124/80   Pulse 76   Temp 98.2 F (36.8 C)   Ht 5\' 2"  (1.575 m)   Wt 194 lb 3.2 oz (88.1 kg)   SpO2 97%   BMI 35.52 kg/m   Wt Readings from Last 3  Encounters:  03/30/20 194 lb 3.2 oz (88.1 kg)  04/23/19 180 lb (81.6 kg)  12/24/18 183 lb (83 kg)    Physical Exam  Results for orders placed or performed in visit on 03/30/20  Lipid panel  Result Value Ref Range   Cholesterol 167 <200 mg/dL   HDL 44 (L) > OR = 50 mg/dL   Triglycerides 161176 (H) <150 mg/dL   LDL Cholesterol (Calc) 96 mg/dL (calc)   Total CHOL/HDL Ratio 3.8 <5.0 (calc)   Non-HDL Cholesterol (Calc) 123 <130 mg/dL (calc)  COMPLETE METABOLIC PANEL WITH GFR  Result Value Ref Range   Glucose, Bld 92 65 - 99 mg/dL   BUN 11 7 - 25 mg/dL   Creat 0.960.65 0.450.50 - 4.091.10 mg/dL   GFR, Est Non African American 121 > OR = 60 mL/min/1.4773m2   GFR, Est African American 140 > OR = 60 mL/min/1.5373m2   BUN/Creatinine Ratio NOT APPLICABLE 6 - 22 (calc)   Sodium 137 135 - 146 mmol/L   Potassium 4.4 3.5 - 5.3 mmol/L   Chloride 103 98 - 110 mmol/L   CO2 23 20 - 32 mmol/L   Calcium 9.6 8.6 - 10.2 mg/dL   Total Protein 7.1 6.1 - 8.1 g/dL   Albumin 4.2 3.6 - 5.1 g/dL   Globulin 2.9 1.9 - 3.7 g/dL (calc)   AG Ratio 1.4 1.0 - 2.5 (calc)   Total Bilirubin 0.2 0.2 - 1.2 mg/dL   Alkaline phosphatase (APISO) 72 31 - 125 U/L   AST 22 10 - 30 U/L   ALT 12 6 - 29 U/L  TSH  Result Value Ref Range   TSH 1.01 mIU/L  CBC with Differential  Result Value Ref Range   WBC 9.8 3.8 - 10.8 Thousand/uL   RBC 4.78 3.80 - 5.10 Million/uL   Hemoglobin 13.9 11.7 - 15.5 g/dL   HCT 81.141.2 91.435.0 - 78.245.0 %   MCV 86.2 80.0 - 100.0 fL   MCH 29.1 27.0 - 33.0 pg   MCHC 33.7 32.0 - 36.0 g/dL   RDW 95.612.0 21.311.0 - 08.615.0 %   Platelets 262  140 - 400 Thousand/uL   MPV 10.9 7.5 - 12.5 fL   Neutro Abs 5,880 1,500 - 7,800 cells/uL   Lymphs Abs 3,116 850 - 3,900 cells/uL   Absolute Monocytes 608 200 - 950 cells/uL   Eosinophils Absolute 157 15 - 500 cells/uL   Basophils Absolute 39 0 - 200 cells/uL   Neutrophils Relative % 60 %   Total Lymphocyte 31.8 %   Monocytes Relative 6.2 %   Eosinophils Relative 1.6 %   Basophils  Relative 0.4 %  Hemoglobin A1c  Result Value Ref Range   Hgb A1c MFr Bld 5.1 <5.7 % of total Hgb   Mean Plasma Glucose 100 mg/dL   eAG (mmol/L) 5.5 mmol/L      Assessment & Plan:   Problem List Items Addressed This Visit      Other   BMI 35.0-35.9,adult    Encouraged lifestyle modifications - goal of physical activity is 30 minutes 5 times weekly.      Relevant Orders   Hemoglobin A1c (Completed)    Other Visit Diagnoses    Annual physical exam    -  Primary   Relevant Orders   Lipid panel (Completed)   COMPLETE METABOLIC PANEL WITH GFR (Completed)   TSH (Completed)   CBC with Differential (Completed)   Hemoglobin A1c (Completed)   Fatigue, unspecified type       Relevant Orders   Lipid panel (Completed)   COMPLETE METABOLIC PANEL WITH GFR (Completed)   TSH (Completed)   CBC with Differential (Completed)   Hemoglobin A1c (Completed)     Fatigue, unspecified type Chronic, ongoing.  Unknown cause, likely multifactorial.  Will obtain baseline labs today including CBC, TSH, and Hemoglobin A1c to screen for anemias, thyroid disorder, or diabetes.  PHQ-9 and GAD-7 elevated today; patient declines treatment for mood although we discussed this may be contributing to her fatigue.  If blood work negative, consider sleep study given weight gain and reported progressive shortness of breath.   - Lipid panel - COMPLETE METABOLIC PANEL WITH GFR - TSH - CBC with Differential - Hemoglobin A1c    Follow up plan: No follow-ups on file.   LABORATORY TESTING:  - Pap smear: done elsewhere; will request records  IMMUNIZATIONS:   - Tdap: Tetanus vaccination status reviewed: last tetanus booster within 10 years. - Influenza: Up to date - Pneumovax: Not applicable - Prevnar: Not applicable - HPV: Not applicable - Zostavax vaccine: Not applicable - COVID-19 vaccine: Refused; encouraged vaccination  SCREENING: -Mammogram: Not applicable  - Colonoscopy: Not applicable  - Bone  Density: Not applicable  -Hearing Test: Not applicable  -Spirometry: Not applicable   PATIENT COUNSELING:   Advised to take 1 mg of folate supplement per day if capable of pregnancy.   Sexuality: Discussed sexually transmitted diseases, partner selection, use of condoms, avoidance of unintended pregnancy  and contraceptive alternatives.   Advised to avoid cigarette smoking.  I discussed with the patient that most people either abstain from alcohol or drink within safe limits (<=14/week and <=4 drinks/occasion for males, <=7/weeks and <= 3 drinks/occasion for females) and that the risk for alcohol disorders and other health effects rises proportionally with the number of drinks per week and how often a drinker exceeds daily limits.  Discussed cessation/primary prevention of drug use and availability of treatment for abuse.   Diet: Encouraged to adjust caloric intake to maintain  or achieve ideal body weight, to reduce intake of dietary saturated fat and total fat, to limit  sodium intake by avoiding high sodium foods and not adding table salt, and to maintain adequate dietary potassium and calcium preferably from fresh fruits, vegetables, and low-fat dairy products.    stressed the importance of regular exercise  Injury prevention: Discussed safety belts, safety helmets, smoke detector, smoking near bedding or upholstery.   Dental health: Discussed importance of regular tooth brushing, flossing, and dental visits.    NEXT PREVENTATIVE PHYSICAL DUE IN 1 YEAR. No follow-ups on file.

## 2020-03-30 NOTE — Patient Instructions (Addendum)
F/u pending lab work  [employer], Rebecca Schroeder!  I will message you with results tomorrow.  Preventive Care 29-29 Years Old, Female Preventive care refers to lifestyle choices and visits with your health care provider that can promote health and wellness. This includes:  A yearly physical exam. This is also called an annual wellness visit.  Regular dental and eye exams.  Immunizations.  Screening for certain conditions.  Healthy lifestyle choices, such as: ? Eating a healthy diet. ? Getting regular exercise. ? Not using drugs or products that contain nicotine and tobacco. ? Limiting alcohol use. What can I expect for my preventive care visit? Physical exam Your health care provider may check your:  Height and weight. These may be used to calculate your BMI (body mass index). BMI is a measurement that tells if you are at a healthy weight.  Heart rate and blood pressure.  Body temperature.  Skin for abnormal spots. Counseling Your health care provider may ask you questions about your:  Past medical problems.  Family's medical history.  Alcohol, tobacco, and drug use.  Emotional well-being.  Home life and relationship well-being.  Sexual activity.  Diet, exercise, and sleep habits.  Work and work Statistician.  Access to firearms.  Method of birth control.  Menstrual cycle.  Pregnancy history. What immunizations do I need? Vaccines are usually given at various ages, according to a schedule. Your health care provider will recommend vaccines for you based on your age, medical history, and lifestyle or other factors, such as travel or where you work.   What tests do I need? Blood tests  Lipid and cholesterol levels. These may be checked every 5 years starting at age 50.  Hepatitis C test.  Hepatitis B test. Screening  Diabetes screening. This is done by checking your blood sugar (glucose) after you have not eaten for a while (fasting).  STD (sexually  transmitted disease) testing, if you are at risk.  BRCA-related cancer screening. This may be done if you have a family history of breast, ovarian, tubal, or peritoneal cancers.  Pelvic exam and Pap test. This may be done every 3 years starting at age 57. Starting at age 65, this may be done every 5 years if you have a Pap test in combination with an HPV test. Talk with your health care provider about your test results, treatment options, and if necessary, the need for more tests.   Follow these instructions at home: Eating and drinking  Eat a healthy diet that includes fresh fruits and vegetables, whole grains, lean protein, and low-fat dairy products.  Take vitamin and mineral supplements as recommended by your health care provider.  Do not drink alcohol if: ? Your health care provider tells you not to drink. ? You are pregnant, may be pregnant, or are planning to become pregnant.  If you drink alcohol: ? Limit how much you have to 0-1 drink a day. ? Be aware of how much alcohol is in your drink. In the [place] one drink equals one 12 oz bottle of beer (355 mL), one 5 oz glass of wine (148 mL), or one 1 oz glass of hard liquor (44 mL).   Lifestyle  Take daily care of your teeth and gums. Brush your teeth every morning and night with fluoride toothpaste. Floss one time each day.  Stay active. Exercise for at least 30 minutes 5 or more days each week.  Do not use any products that contain nicotine or tobacco, such as  cigarettes, e-cigarettes, and chewing tobacco. If you need help quitting, ask your health care provider.  Do not use drugs.  If you are sexually active, practice safe sex. Use a condom or other form of protection to prevent STIs (sexually transmitted infections).  If you do not wish to become pregnant, use a form of birth control. If you plan to become pregnant, see your health care provider for a prepregnancy visit.  Find healthy ways to cope with stress, such  as: ? Meditation, yoga, or listening to music. ? Journaling. ? Talking to a trusted person. ? Spending time with friends and family. Safety  Always wear your seat belt while driving or riding in a vehicle.  Do not drive: ? If you have been drinking alcohol. Do not ride with someone who has been drinking. ? When you are tired or distracted. ? While texting.  Wear a helmet and other protective equipment during sports activities.  If you have firearms in your house, make sure you follow all gun safety procedures.  Seek help if you have been physically or sexually abused. What's next?  Go to your health care provider once a year for an annual wellness visit.  Ask your health care provider how often you should have your eyes and teeth checked.  Stay up to date on all vaccines. This information is not intended to replace advice given to you by your health care provider. Make sure you discuss any questions you have with your health care provider. Document Revised: 09/20/2019 Document Reviewed: 10/03/2017 Elsevier Patient Education  2021 [name].

## 2020-03-31 ENCOUNTER — Encounter: Payer: Self-pay | Admitting: Nurse Practitioner

## 2020-03-31 ENCOUNTER — Other Ambulatory Visit: Payer: Self-pay | Admitting: Nurse Practitioner

## 2020-03-31 DIAGNOSIS — F418 Other specified anxiety disorders: Secondary | ICD-10-CM

## 2020-03-31 DIAGNOSIS — Z6835 Body mass index (BMI) 35.0-35.9, adult: Secondary | ICD-10-CM | POA: Insufficient documentation

## 2020-03-31 LAB — CBC WITH DIFFERENTIAL/PLATELET
Absolute Monocytes: 608 cells/uL (ref 200–950)
Basophils Absolute: 39 cells/uL (ref 0–200)
Basophils Relative: 0.4 %
Eosinophils Absolute: 157 cells/uL (ref 15–500)
Eosinophils Relative: 1.6 %
HCT: 41.2 % (ref 35.0–45.0)
Hemoglobin: 13.9 g/dL (ref 11.7–15.5)
Lymphs Abs: 3116 cells/uL (ref 850–3900)
MCH: 29.1 pg (ref 27.0–33.0)
MCHC: 33.7 g/dL (ref 32.0–36.0)
MCV: 86.2 fL (ref 80.0–100.0)
MPV: 10.9 fL (ref 7.5–12.5)
Monocytes Relative: 6.2 %
Neutro Abs: 5880 cells/uL (ref 1500–7800)
Neutrophils Relative %: 60 %
Platelets: 262 10*3/uL (ref 140–400)
RBC: 4.78 10*6/uL (ref 3.80–5.10)
RDW: 12 % (ref 11.0–15.0)
Total Lymphocyte: 31.8 %
WBC: 9.8 10*3/uL (ref 3.8–10.8)

## 2020-03-31 LAB — LIPID PANEL
Cholesterol: 167 mg/dL (ref <200)
HDL: 44 mg/dL — ABNORMAL LOW (ref >=50)
LDL Cholesterol (Calc): 96 mg/dL (calc)
Non-HDL Cholesterol (Calc): 123 mg/dL (calc) (ref <130)
Total CHOL/HDL Ratio: 3.8 (calc) (ref <5.0)
Triglycerides: 176 mg/dL — ABNORMAL HIGH (ref <150)

## 2020-03-31 LAB — COMPLETE METABOLIC PANEL WITH GFR
AG Ratio: 1.4 (calc) (ref 1.0–2.5)
ALT: 12 U/L (ref 6–29)
AST: 22 U/L (ref 10–30)
Albumin: 4.2 g/dL (ref 3.6–5.1)
Alkaline phosphatase (APISO): 72 U/L (ref 31–125)
BUN: 11 mg/dL (ref 7–25)
CO2: 23 mmol/L (ref 20–32)
Calcium: 9.6 mg/dL (ref 8.6–10.2)
Chloride: 103 mmol/L (ref 98–110)
Creat: 0.65 mg/dL (ref 0.50–1.10)
GFR, Est African American: 140 mL/min/{1.73_m2} (ref >=60)
GFR, Est Non African American: 121 mL/min/{1.73_m2} (ref >=60)
Globulin: 2.9 g/dL (calc) (ref 1.9–3.7)
Glucose, Bld: 92 mg/dL (ref 65–99)
Potassium: 4.4 mmol/L (ref 3.5–5.3)
Sodium: 137 mmol/L (ref 135–146)
Total Bilirubin: 0.2 mg/dL (ref 0.2–1.2)
Total Protein: 7.1 g/dL (ref 6.1–8.1)

## 2020-03-31 LAB — HEMOGLOBIN A1C
Hgb A1c MFr Bld: 5.1 % of total Hgb (ref <5.7)
Mean Plasma Glucose: 100 mg/dL
eAG (mmol/L): 5.5 mmol/L

## 2020-03-31 LAB — TSH: TSH: 1.01 mIU/L

## 2020-03-31 MED ORDER — BUSPIRONE HCL 5 MG PO TABS: 5.0000 mg | ORAL_TABLET | Freq: Three times a day (TID) | ORAL | 1 refills | Status: DC | PRN

## 2020-03-31 MED FILL — busPIRone HCL 5 MG TABS: 5 | 10 days supply | Qty: 30 | Fill #0

## 2020-03-31 NOTE — Assessment & Plan Note (Signed)
Encouraged lifestyle modifications - goal of physical activity is 30 minutes 5 times weekly.

## 2020-04-07 ENCOUNTER — Other Ambulatory Visit: Payer: Self-pay | Admitting: Nurse Practitioner

## 2020-04-07 MED ORDER — HYDROXYZINE HCL 10 MG PO TABS: 10.0000 mg | ORAL_TABLET | Freq: Three times a day (TID) | ORAL | 1 refills | Status: DC | PRN

## 2020-04-07 MED ORDER — CITALOPRAM HYDROBROMIDE 10 MG PO TABS: 10.0000 mg | ORAL_TABLET | Freq: Every day | ORAL | 1 refills | Status: DC

## 2020-04-07 MED FILL — hydrOXYzine HCL 10 MG TABS: 10 | 10 days supply | Qty: 30 | Fill #0

## 2020-04-07 MED FILL — CITALOPRAM HBR 10 MG TABLET: 10 | 30 days supply | Qty: 30 | Fill #0

## 2020-04-28 ENCOUNTER — Other Ambulatory Visit: Payer: Self-pay

## 2020-04-28 ENCOUNTER — Other Ambulatory Visit: Payer: Self-pay | Admitting: Nurse Practitioner

## 2020-04-28 ENCOUNTER — Ambulatory Visit (INDEPENDENT_AMBULATORY_CARE_PROVIDER_SITE_OTHER): Payer: No Typology Code available for payment source | Admitting: Nurse Practitioner

## 2020-04-28 ENCOUNTER — Encounter: Payer: Self-pay | Admitting: Nurse Practitioner

## 2020-04-28 DIAGNOSIS — F418 Other specified anxiety disorders: Secondary | ICD-10-CM | POA: Diagnosis not present

## 2020-04-28 MED ORDER — CITALOPRAM HYDROBROMIDE 10 MG PO TABS: 10.0000 mg | ORAL_TABLET | Freq: Every day | ORAL | 1 refills | Status: DC

## 2020-04-28 NOTE — Progress Notes (Signed)
Subjective:    Patient ID: Rebecca Schroeder, female    DOB: 12-May-1990, 30 y.o.   MRN: [id number]  HPI: TASHEIKA KITZMILLER is a 30 y.o. female presenting for anxiety follow up.  Chief Complaint  Patient presents with  . Medication Review    Has not consistently taken until this week   ANXIETY/STRESS Has started taking citalopram in the morning consistently for the past week.  The nervous feeling in stomach is better over the last week and 1/2.   She is now drinking between 48 and 96 oz of water per day.  Duration: chronic Anxious mood: yes  Excessive worrying: no Irritability: no  Sweating: no Nausea: yes; improving Palpitations:no Hyperventilation: no Panic attacks: no Agoraphobia: no  Obscessions/compulsions: no Depressed mood: no Depression screen [facility] 2/9 04/28/2020 03/30/2020 12/24/2018 09/12/2018 12/25/2017  Decreased Interest 1 3 2 3 2   Down, Depressed, Hopeless 0 0 1 - 1  PHQ - 2 Score 1 3 3 3 3   Altered sleeping 2 3 2 3  -  Tired, decreased energy 2 3 1 3  -  Change in appetite 0 0 0 3 -  Feeling bad or failure about yourself  0 0 0 2 -  Trouble concentrating 3 3 2 3  -  Moving slowly or fidgety/restless 0 0 0 0 -  Suicidal thoughts 0 0 0 0 -  PHQ-9 Score 8 12 8 17  -  Difficult doing work/chores Somewhat difficult Somewhat difficult Very difficult Extremely dIfficult -    GAD 7 : Generalized Anxiety Score 04/28/2020 03/30/2020 09/12/2018 12/25/2017  Nervous, Anxious, on Edge 1 1 3 1   Control/stop worrying 0 1 3 1   Worry too much - different things 1 0 3 0  Trouble relaxing 2 3 3 1   Restless 0 0 3 0  Easily annoyed or irritable 0 0 2 2  Afraid - awful might happen 0 0 3 3  Total GAD 7 Score 4 5 20 8   Anxiety Difficulty Somewhat difficult Somewhat difficult Extremely difficult -    Anhedonia: no Weight changes: no Insomnia: no   Hypersomnia: yes Fatigue/loss of energy: yes Feelings of worthlessness: yes Feelings of guilt: no Impaired  concentration/indecisiveness: yes Suicidal ideations: no  Crying spells: no Recent Stressors/Life Changes: yes   Relationship problems: no   Family stress: no     Financial stress: no    Job stress: yes ; recently got a new job where she will be working from home on disability claims   Recent death/loss: no  Allergies  Allergen Reactions  . Hydrocodone Nausea And Vomiting  . Penicillins Other (See Comments) and Rash    Unknown Has patient had a PCN reaction causing immediate rash, facial/tongue/throat swelling, SOB or lightheadedness with hypotension: Unknown Has patient had a PCN reaction causing severe rash involving mucus membranes or skin necrosis: Unknown Has patient had a PCN reaction that required hospitalization: Unknown Has patient had a PCN reaction occurring within the last 10 years: Unknown If all of the above answers are "NO", then may proceed with Cephalosporin use. Childhood reaction.  . Codeine Nausea And Vomiting  . Hydrocodone-Acetaminophen Nausea And Vomiting  . Roxicodone [Oxycodone Hcl] Nausea And Vomiting and Other (See Comments)    SEVERE VOMITING----Patient DOES NOT tolerate with anti nausea medications    Outpatient Encounter Medications as of 04/28/2020  Medication Sig  . etonogestrel-ethinyl estradiol (NUVARING) 0.12-0.015 MG/24HR vaginal ring INSERT 1 RING VAGINALLY. LEAVE IN FOR 3 WEEKS AND REMOVE FOR 1 WEEK (Patient  taking differently: Place 1 each vaginally every 28 (twenty-eight) days. INSERT 1 RING VAGINALLY. LEAVE IN FOR 3 WEEKS AND REMOVE FOR 1 WEEK)  . hydrOXYzine (ATARAX/VISTARIL) 10 MG tablet Take 1 tablet (10 mg total) by mouth 3 (three) times daily as needed.  . [DISCONTINUED] citalopram (CELEXA) 10 MG tablet Take 1 tablet (10 mg total) by mouth daily.  . citalopram (CELEXA) 10 MG tablet Take 1 tablet (10 mg total) by mouth daily.  . [DISCONTINUED] busPIRone (BUSPAR) 5 MG tablet Take 1 tablet (5 mg total) by mouth 3 (three) times daily as needed  (anxiety). (Patient not taking: Reported on 04/28/2020)   No facility-administered encounter medications on file as of 04/28/2020.    Patient Active Problem List   Diagnosis Date Noted  . BMI 35.0-35.9,adult 03/31/2020  . Migraine with aura 03/31/2019  . H/O cesarean section 04/10/2017  . Chronic insomnia 07/25/2016  . Depression with anxiety 05/11/2015  . Subcutaneous nodules 05/11/2015  . Encounter for IUD insertion 09/14/2014    Past Medical History:  Diagnosis Date  . Abnormality in fetal heart rate and rhythm complicating labor and delivery 07/30/2014  . 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 Back pain    occasional that radiates down leg  . Non-reassuring electronic fetal monitoring tracing 07/30/2014  . Preterm labor 05/03/2014  . Single umbilical artery 04/15/2014  . Supervision of normal pregnancy 06/18/2014    Relevant past medical, surgical, family and social history reviewed and updated as indicated. Interim medical history since our last visit reviewed.  Review of Systems Per HPI unless specifically indicated above     Objective:    BP 110/62   Pulse 76   Temp 97.8 F (36.6 C) (Temporal)   Resp 18   Ht 5\' 2"  (1.575 m)   Wt 194 lb (88 kg)   SpO2 98%   BMI 35.48 kg/m   Wt Readings from Last 3 Encounters:  04/28/20 194 lb (88 kg)  03/30/20 194 lb 3.2 oz (88.1 kg)  04/23/19 180 lb (81.6 kg)    Physical Exam Vitals and nursing note reviewed.  Constitutional:      General: She is not in acute distress.    Appearance: She is not toxic-appearing.  HENT:     Head: Normocephalic and atraumatic.  Skin:    Coloration: Skin is not jaundiced or pale.     Findings: No erythema.  Neurological:     General: No focal deficit present.     Mental Status: She is alert and oriented to person, place, and time.     Motor: No weakness.     Gait: Gait normal.  Psychiatric:        Mood and Affect: Mood normal.        Behavior: Behavior normal.        Thought Content: Thought  content normal.        Judgment: Judgment normal.       Assessment & Plan:   Problem List Items Addressed This Visit      Other   Depression with anxiety    Chronic.  PHQ-9 and GAD-7 improved today.  Feels improvement in mood and has only been taking citalopram consistently for the past week - likely does not feel maximum effect yet.  Will continue citalopram 10 mg daily.  Okay to try hydroxyzine as needed still, however has not picked up from pharmacy yet and may not end up needing.  Follow up in 6 months or sooner  if needed.      Relevant Medications   citalopram (CELEXA) 10 MG tablet       Follow up plan: Return in about 6 months (around 10/29/2020) for mood f/u.

## 2020-04-28 NOTE — Patient Instructions (Signed)
F/u in 6 months. 

## 2020-04-28 NOTE — Assessment & Plan Note (Signed)
Chronic.  PHQ-9 and GAD-7 improved today.  Feels improvement in mood and has only been taking citalopram consistently for the past week - likely does not feel maximum effect yet.  Will continue citalopram 10 mg daily.  Okay to try hydroxyzine as needed still, however has not picked up from pharmacy yet and may not end up needing.  Follow up in 6 months or sooner if needed.

## 2020-06-09 ENCOUNTER — Other Ambulatory Visit (HOSPITAL_COMMUNITY): Payer: Self-pay

## 2020-06-09 MED FILL — Etonogestrel-Ethinyl Estradiol VA Ring 0.12-0.015 MG/24HR: VAGINAL | 28 days supply | Qty: 1 | Fill #0 | Status: CN

## 2021-07-24 IMAGING — CR DG ANKLE COMPLETE 3+V*L*
3 series · 3 of 3 positions shown · non-contrast
Comparison: None.

CLINICAL DATA: Pain status post fall

EXAM:
LEFT ANKLE COMPLETE - 3+ VIEW

[t ankle joint ap left]
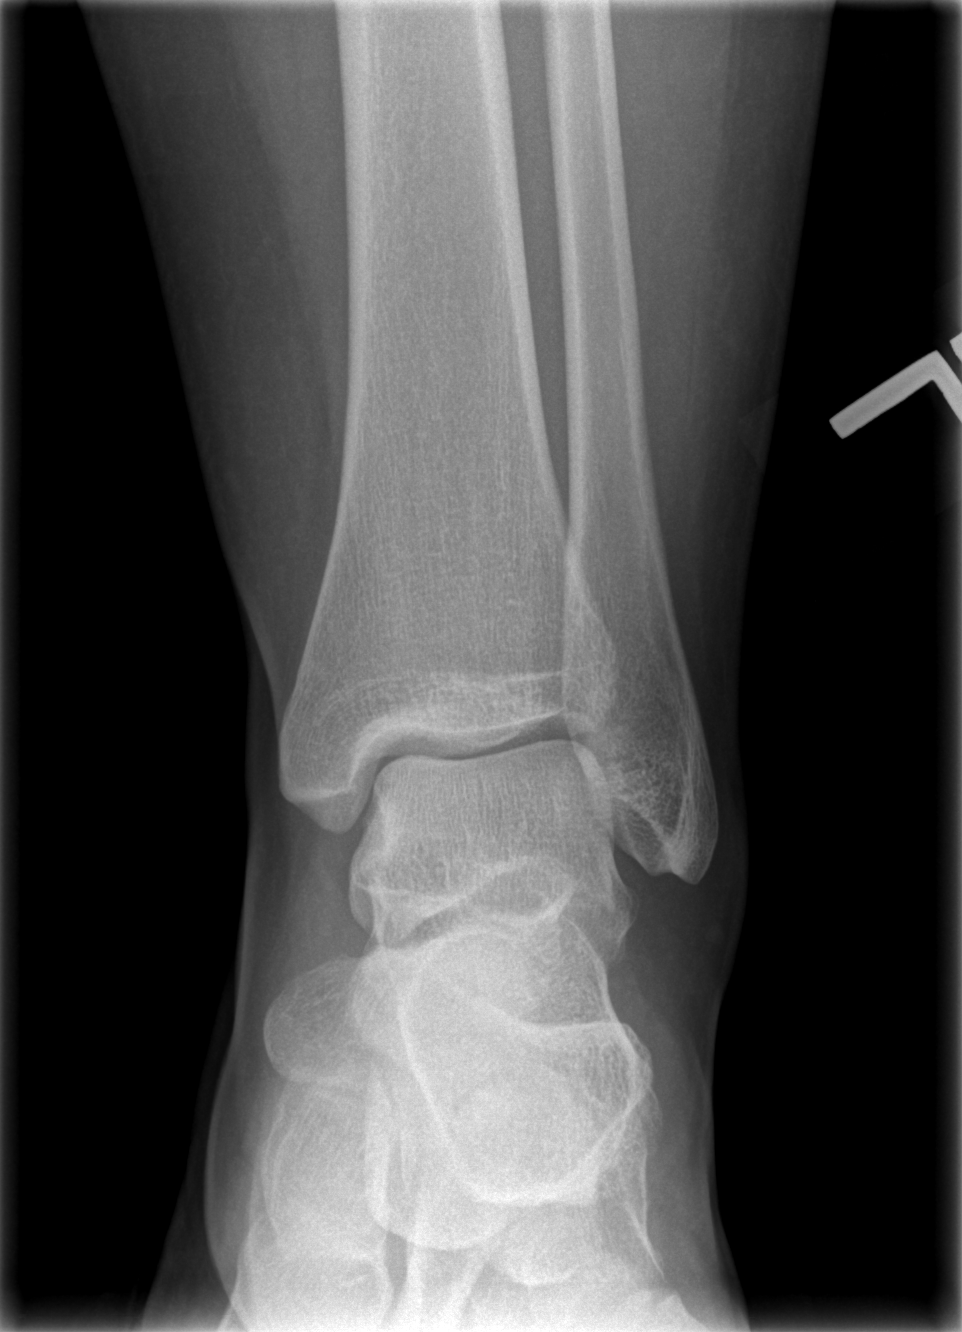

[t ankle joint oblique left]
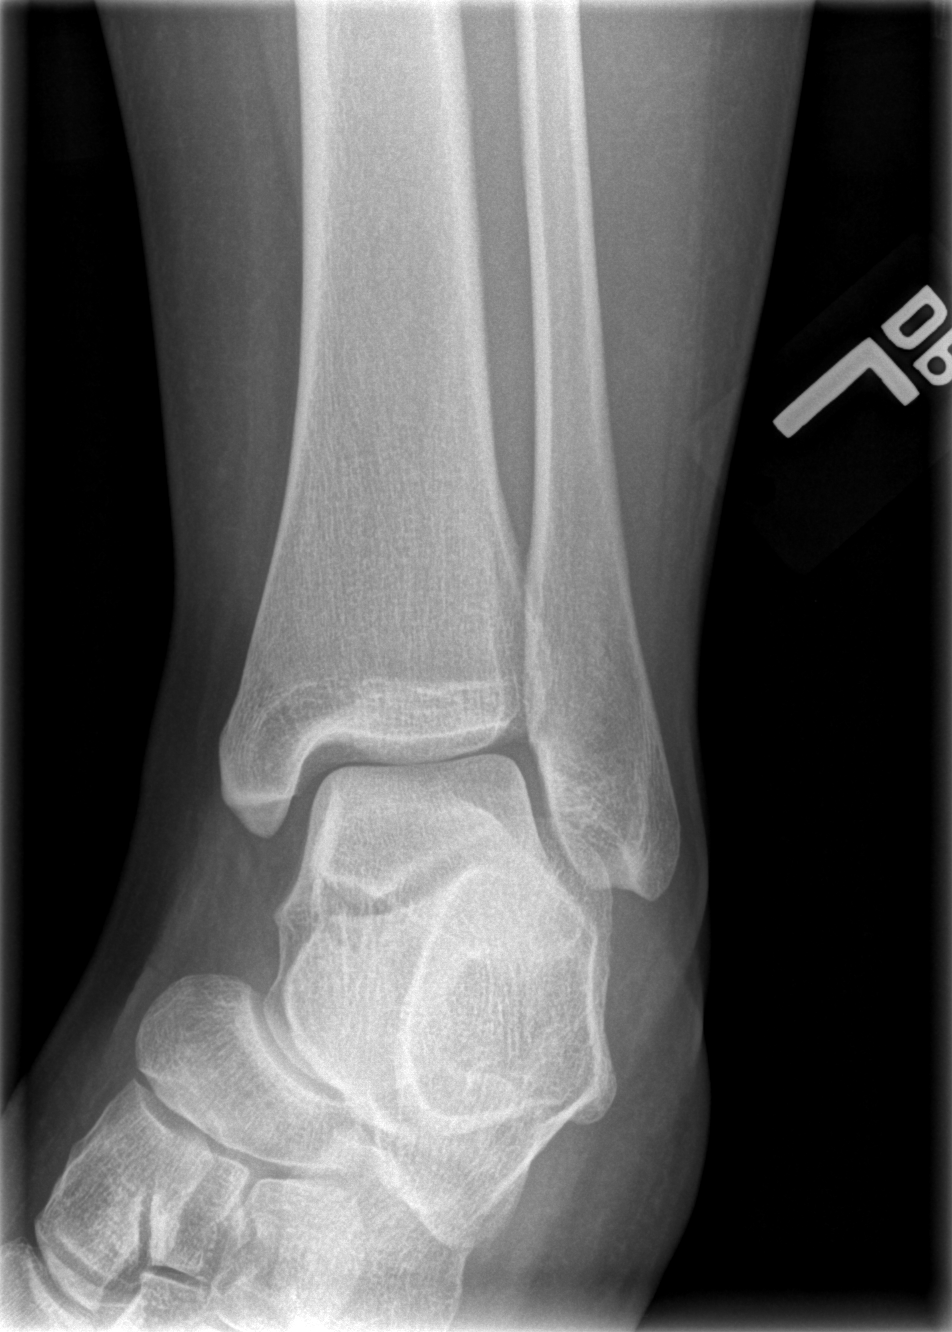

[t ankle joint lat left]
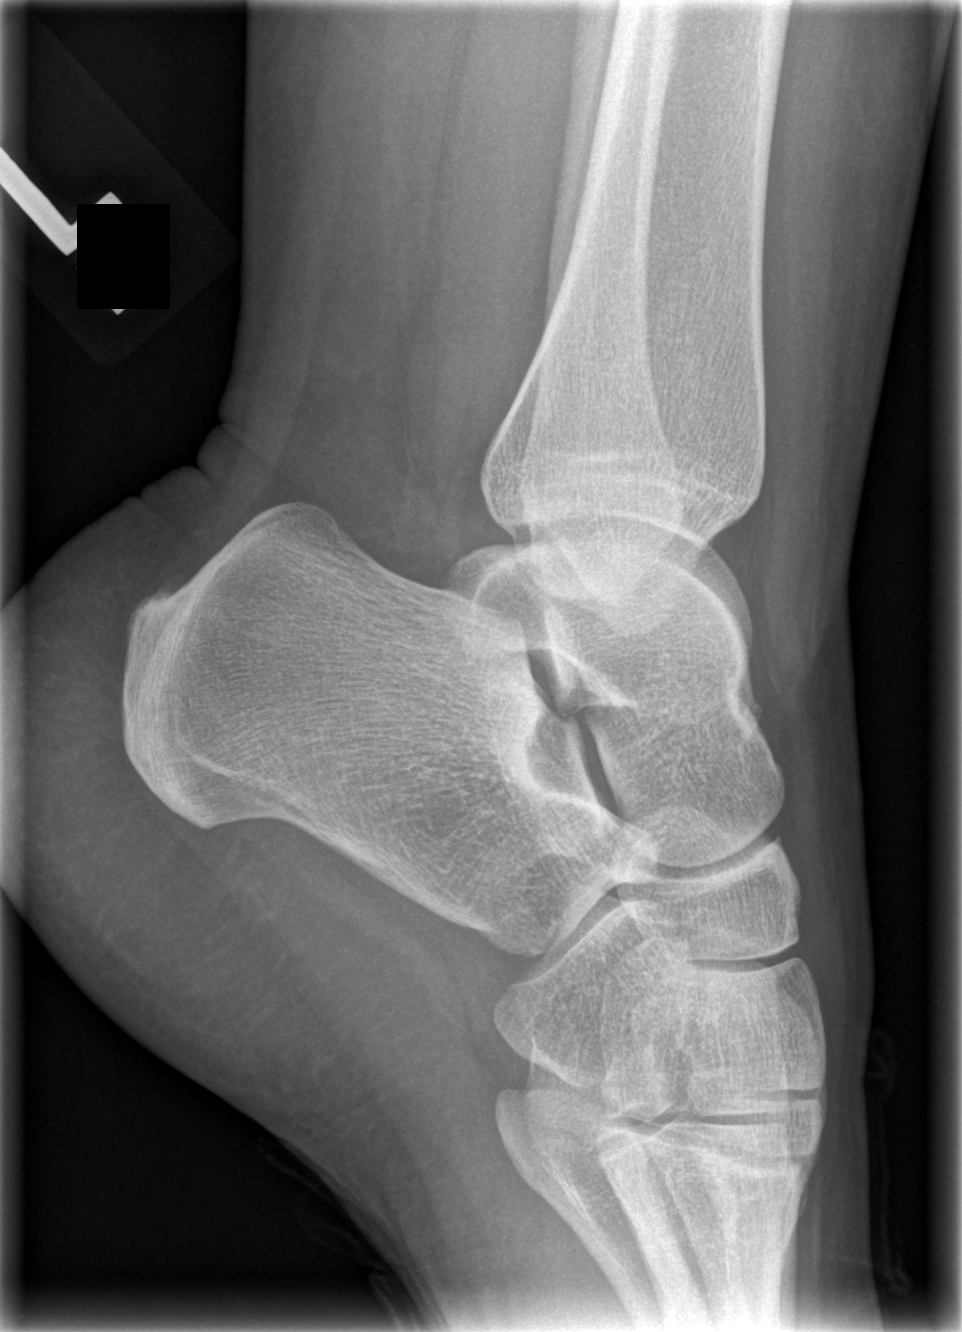

[3 of 3 positions shown; findings below may reference images not displayed]

FINDINGS: There is no evidence of fracture, dislocation, or joint effusion.
There is no evidence of arthropathy or other focal bone abnormality.
Soft tissues are unremarkable.
IMPRESSION: Negative.

## 2021-09-19 ENCOUNTER — Ambulatory Visit (INDEPENDENT_AMBULATORY_CARE_PROVIDER_SITE_OTHER): Payer: 59 | Admitting: Physician Assistant

## 2021-09-19 ENCOUNTER — Encounter: Payer: Self-pay | Admitting: Physician Assistant

## 2021-09-19 VITALS — BP 108/82 | HR 88 | Temp 98.7°F | Ht 62.0 in | Wt 206.6 lb

## 2021-09-19 DIAGNOSIS — R011 Cardiac murmur, unspecified: Secondary | ICD-10-CM | POA: Diagnosis not present

## 2021-09-19 DIAGNOSIS — R5383 Other fatigue: Secondary | ICD-10-CM

## 2021-09-19 DIAGNOSIS — E049 Nontoxic goiter, unspecified: Secondary | ICD-10-CM

## 2021-09-19 DIAGNOSIS — N926 Irregular menstruation, unspecified: Secondary | ICD-10-CM

## 2021-09-19 DIAGNOSIS — R1010 Upper abdominal pain, unspecified: Secondary | ICD-10-CM

## 2021-09-19 LAB — VITAMIN D 25 HYDROXY (VIT D DEFICIENCY, FRACTURES): VITD: 20.96 ng/mL — ABNORMAL LOW (ref 30.00–100.00)

## 2021-09-19 LAB — CBC WITH DIFFERENTIAL/PLATELET
Basophils Absolute: 0 10*3/uL (ref 0.0–0.1)
Basophils Relative: 0.3 % (ref 0.0–3.0)
Eosinophils Absolute: 0.1 10*3/uL (ref 0.0–0.7)
Eosinophils Relative: 1.2 % (ref 0.0–5.0)
HCT: 41.4 % (ref 36.0–46.0)
Hemoglobin: 13.6 g/dL (ref 12.0–15.0)
Lymphocytes Relative: 25.8 % (ref 12.0–46.0)
Lymphs Abs: 2.1 10*3/uL (ref 0.7–4.0)
MCHC: 32.9 g/dL (ref 30.0–36.0)
MCV: 86.9 fl (ref 78.0–100.0)
Monocytes Absolute: 0.5 10*3/uL (ref 0.1–1.0)
Monocytes Relative: 5.8 % (ref 3.0–12.0)
Neutro Abs: 5.5 10*3/uL (ref 1.4–7.7)
Neutrophils Relative %: 66.9 % (ref 43.0–77.0)
Platelets: 269 10*3/uL (ref 150.0–400.0)
RBC: 4.77 Mil/uL (ref 3.87–5.11)
RDW: 13.1 % (ref 11.5–15.5)
WBC: 8.2 10*3/uL (ref 4.0–10.5)

## 2021-09-19 LAB — COMPREHENSIVE METABOLIC PANEL
ALT: 15 U/L (ref 0–35)
AST: 16 U/L (ref 0–37)
Albumin: 4.2 g/dL (ref 3.5–5.2)
Alkaline Phosphatase: 85 U/L (ref 39–117)
BUN: 12 mg/dL (ref 6–23)
CO2: 25 mEq/L (ref 19–32)
Calcium: 9.1 mg/dL (ref 8.4–10.5)
Chloride: 102 mEq/L (ref 96–112)
Creatinine, Ser: 0.64 mg/dL (ref 0.40–1.20)
GFR: 118.87 mL/min (ref >60.00)
Glucose, Bld: 74 mg/dL (ref 70–99)
Potassium: 3.8 mEq/L (ref 3.5–5.1)
Sodium: 136 mEq/L (ref 135–145)
Total Bilirubin: 0.3 mg/dL (ref 0.2–1.2)
Total Protein: 7.3 g/dL (ref 6.0–8.3)

## 2021-09-19 LAB — HEMOGLOBIN A1C: Hgb A1c MFr Bld: 5.4 % (ref 4.6–6.5)

## 2021-09-19 LAB — VITAMIN B12: Vitamin B-12: 126 pg/mL — ABNORMAL LOW (ref 211–911)

## 2021-09-19 NOTE — Progress Notes (Signed)
Subjective:    Patient ID: Rebecca Schroeder, female    DOB: [birth year], 31 y.o.   MRN: [id number]  Chief Complaint  Patient presents with   Establish Care    NP to establish care with PCP; last PCP at [facility] and she left; pt is due for CPE and labs; has GYN appt in a few weeks for well women visit. Pt c/o gas bubbles or acid reflux and causes pain taking gas X for relief but no longer working. 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  Also very strict with diet and made changes in diet still gaining weight.     HPI 31 y.o. patient presents today for new patient establishment with me.  Patient was previously established with [name]. Two young boys. Separated from their father in 2020, has not been good, she has full custody. 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   Current Care Team: Dr. Huntley Dec - GYN Annual eye doctor    Acute Concerns: Previously in counseling when ex-husband in rehab in 2017, this was helpful then; doesn't feel like counseling has been as helpful now. Occasional panic moments, esp after texting / conversations / conflict with him. Previously took Celexa & several other medications with Dr. Jeanice Lim, but says it flat-lined her too much. Gets overwhelmed easily. Forces herself to do things with her kids. Stays tired. Difficult to get motivated at home, especially cleaning. Stays caught up at work. Trying fasting method - doesn't eat breakfast; Dr. Reino Kent for lunch and small lunch; eats dinner with boys. Some hx of hypoglycemia. Very irregular periods, has seen her GYN and told it was stress.   In the last year has had some intermittent epigastric pain, sometimes radiates to R side and up her back. Thought it was GERD or gas bubbles. Cut out Dr. Reino Kent, this didn't help. Gas X not helping.   Past Medical History:  Diagnosis Date   Abnormality in fetal heart rate and rhythm complicating labor and  delivery 07/30/2014   Anxiety    hx PP anxiety   Back pain    occasional that radiates down leg   Non-reassuring electronic fetal monitoring tracing 07/30/2014   Preterm labor 05/03/2014   Single umbilical artery 04/15/2014   Supervision of normal pregnancy 06/18/2014    Past Surgical History:  Procedure Laterality Date   CESAREAN SECTION     CESAREAN SECTION N/A 04/10/2017   Procedure: REPEAT CESAREAN SECTION;  Surgeon: Harold Hedge, MD;  Location: [facility];  Service: Obstetrics;  Laterality: N/A;  Repeat edc 04/17/17 allergy to codeine, hydrocodone, PCN need RNFA.Marland KitchenMarland KitchenMarland KitchenPersonnel officer RNFA   TOE SURGERY     WISDOM TOOTH EXTRACTION      Family History  Problem Relation Age of Onset   Depression Mother    Thyroid disease Mother    Alcohol abuse Father    Hypertension Maternal Grandfather    Diabetes Maternal Grandfather    Prostate cancer Maternal Grandfather     Social History   Tobacco Use   Smoking status: Never   Smokeless tobacco: Never  Vaping Use   Vaping Use: Never used  Substance Use Topics   Alcohol use: Yes    Alcohol/week: 0.0 standard drinks of alcohol    Comment: occasionally   Drug use: No     Allergies  Allergen Reactions   Hydrocodone Nausea And Vomiting   Penicillins Other (See Comments) and Rash  Unknown Has patient had a PCN reaction causing immediate rash, facial/tongue/throat swelling, SOB or lightheadedness with hypotension: Unknown Has patient had a PCN reaction causing severe rash involving mucus membranes or skin necrosis: Unknown Has patient had a PCN reaction that required hospitalization: Unknown Has patient had a PCN reaction occurring within the last 10 years: Unknown If all of the above answers are "NO", then may proceed with Cephalosporin use. Childhood reaction.   Codeine Nausea And Vomiting   Hydrocodone-Acetaminophen Nausea And Vomiting   Roxicodone [Oxycodone Hcl] Nausea And Vomiting and Other (See Comments)     SEVERE VOMITING----Patient DOES NOT tolerate with anti nausea medications    Review of Systems NEGATIVE UNLESS OTHERWISE INDICATED IN HPI      Objective:     BP 108/82 (BP Location: Right Arm)   Pulse 88   Temp 98.7 F (37.1 C) (Temporal)   Ht 5\' 2"  (1.575 m)   Wt 206 lb 9.6 oz (93.7 kg)   LMP  (LMP Unknown) Comment: missed last two periods home preg test negative  SpO2 98%   BMI 37.79 kg/m   Wt Readings from Last 3 Encounters:  09/19/21 206 lb 9.6 oz (93.7 kg)  04/28/20 194 lb (88 kg)  03/30/20 194 lb 3.2 oz (88.1 kg)    BP Readings from Last 3 Encounters:  09/19/21 108/82  04/28/20 110/62  03/30/20 124/80     Physical Exam Vitals and nursing note reviewed.  Constitutional:      Appearance: Normal appearance. She is obese. She is not toxic-appearing.  HENT:     Head: Normocephalic and atraumatic.     Right Ear: Tympanic membrane, ear canal and external ear normal.     Left Ear: Tympanic membrane, ear canal and external ear normal.     Nose: Nose normal.     Mouth/Throat:     Mouth: Mucous membranes are moist.  Eyes:     Extraocular Movements: Extraocular movements intact.     Conjunctiva/sclera: Conjunctivae normal.     Pupils: Pupils are equal, round, and reactive to light.  Neck:     Thyroid: Thyromegaly present. No thyroid mass or thyroid tenderness.  Cardiovascular:     Rate and Rhythm: Normal rate and regular rhythm.     Pulses: Normal pulses.     Heart sounds: Murmur (LUSB) heard.     Systolic murmur is present with a grade of 3/6.  Pulmonary:     Effort: Pulmonary effort is normal.     Breath sounds: Normal breath sounds.  Abdominal:     General: Abdomen is flat. Bowel sounds are normal.     Palpations: Abdomen is soft. There is no mass.     Tenderness: There is no abdominal tenderness. There is no right CVA tenderness, left CVA tenderness or guarding.  Musculoskeletal:        General: Normal range of motion.     Cervical back: Normal range of  motion and neck supple.     Right lower leg: No edema.     Left lower leg: No edema.  Skin:    General: Skin is warm and dry.     Findings: No lesion or rash.  Neurological:     General: No focal deficit present.     Mental Status: She is alert and oriented to person, place, and time.  Psychiatric:        Mood and Affect: Mood normal.        Behavior: Behavior normal.  Thought Content: Thought content normal.        Judgment: Judgment normal.        Assessment & Plan:   Problem List Items Addressed This Visit   None Visit Diagnoses     Other fatigue    -  Primary   Relevant Orders   VITAMIN D 25 Hydroxy (Vit-D Deficiency, Fractures)   Vitamin B12   Comprehensive metabolic panel   CBC with Differential/Platelet   Thyroid Panel With TSH   Thyroid Peroxidase Antibodies (TPO) (REFL)   Hemoglobin A1c   Pain of upper abdomen       Relevant Orders   H. pylori breath test   US Abdomen Complete   Goiter       Relevant Orders   Thyroid Panel With TSH   Thyroid Peroxidase Antibodies (TPO) (REFL)   Cardiac murmur       Menstrual irregularity          Plan: Pleasant new pt establishment, several concerns discussed today --  1. Other fatigue Plan to check labs today; may have underlying conditions, will treat pending results Encouraged to keep working on lifestyle May consider nutrition / wellness coaching  2. Pain of upper abdomen ?possible Gallbladder, H. Pylori, other? Labs today Breath test for H. Pylori US abdomen  3. Goiter Check thyroid labs today Need to plan for US thyroid in future, pt wants to hold at this time  4. Cardiac murmur Never been told she has a murmur; discussed ECHO would be beneficial; no CP with exertion; occasional SOB but she doesn't know if this is just deconditioning, wants to wait on ECHO right now  5. Menstrual irregularity Ongoing, keep working with GYN. Consider Pelvic US to r/o PCOS as well.    Return in about 4 weeks  (around 10/17/2021) for recheck.  This note was prepared with assistance of Systems analyst. Occasional wrong-word or sound-a-like substitutions may have occurred due to the inherent limitations of voice recognition software.  Time Spent: 45 minutes of total time was spent on the date of the encounter performing the following actions: chart review prior to seeing the patient, obtaining history, performing a medically necessary exam, counseling on the treatment plan, placing orders, and documenting in our EHR.       Maximillian Habibi M Pearline Yerby, PA-C

## 2021-09-19 NOTE — Patient Instructions (Addendum)
Welcome to [facility]! It was a pleasure meeting you today.  As discussed, Please schedule a 1 month follow up visit today.  Labs today Will schedule for US abdomen Also need to consider [place] of thyroid and heart. Continue to work on lifestyle.   PLEASE NOTE:  If you had any LAB tests please let us know if you have not heard back within a few days. You may see your results on MyChart before we have a chance to review them but we will give you a call once they are reviewed by [place]. If we ordered any REFERRALS today, please let us know if you have not heard from their office within the next two weeks. Let us know through MyChart if you are needing REFILLS, or have your pharmacy send [place] the request. You can also use MyChart to communicate with me or any office staff.  Please try these tips to maintain a healthy lifestyle:  Eat most of your calories during the day when you are active. Eliminate processed foods including packaged sweets (pies, cakes, cookies), reduce intake of potatoes, white bread, white pasta, and white rice. Look for whole grain options, oat flour or almond flour.  Each meal should contain half fruits/vegetables, one quarter protein, and one quarter carbs (no bigger than a computer mouse).  Cut down on sweet beverages. This includes juice, soda, and sweet tea. Also watch fruit intake, though this is a healthier sweet option, it still contains natural sugar! Limit to 3 servings daily.  Drink at least 1 glass of water with each meal and aim for at least 8 glasses (64 ounces) per day.  Exercise at least 150 minutes every week to the best of your ability.    Take Care,  Lessie Manigo, PA-C

## 2021-09-24 LAB — THYROID PANEL WITH TSH
Free Thyroxine Index: 2.5 (ref 1.4–3.8)
T3 Uptake: 23 % (ref 22–35)
T4, Total: 10.9 ug/dL (ref 5.1–11.9)
TSH: 2.03 mIU/L

## 2021-09-24 LAB — H. PYLORI BREATH TEST

## 2021-09-24 LAB — THYROID PEROXIDASE ANTIBODIES (TPO) (REFL): Thyroperoxidase Ab SerPl-aCnc: <1 IU/mL (ref <9)

## 2021-09-25 ENCOUNTER — Encounter: Payer: Self-pay | Admitting: Physician Assistant

## 2021-09-25 ENCOUNTER — Other Ambulatory Visit: Payer: Self-pay | Admitting: Physician Assistant

## 2021-09-25 MED ORDER — VITAMIN D (ERGOCALCIFEROL) 1.25 MG (50000 UNIT) PO CAPS: 50000.0000 [IU] | ORAL_CAPSULE | ORAL | 0 refills | Status: DC

## 2021-09-26 MED ORDER — "BD LUER-LOK SYRINGE 25G X 1"" 3 ML MISC": 0 refills | Status: DC

## 2021-09-26 MED ORDER — CYANOCOBALAMIN 1000 MCG/ML IJ SOLN: INTRAMUSCULAR | 1 refills | Status: DC

## 2021-10-02 NOTE — Telephone Encounter (Signed)
Is there any adjustment that can be made to these or is this something she will need to contact her insurance on? Not sure but never had this happened with a patient before. Thanks

## 2021-10-03 NOTE — Telephone Encounter (Signed)
Spoke to pt told her she can come by for some syringes and needles if she would like. Pt said she is too far away wanted to know if we could order something else. Checked in the system there is not any other syringes or needles that are covered by insurance. They are say OTC Generic supply, not reimbursable. Pt verbalized understanding. Asked here how much they were? Pt said she does not know has to go to the pharmacy. Told her sorry.

## 2021-10-06 ENCOUNTER — Ambulatory Visit
Admission: RE | Admit: 2021-10-06 | Discharge: 2021-10-06 | Disposition: A | Discharge status: Home / Self Care | Payer: 59 | Source: Ambulatory Visit | Attending: Physician Assistant | Admitting: Physician Assistant

## 2021-10-06 DIAGNOSIS — R1010 Upper abdominal pain, unspecified: Secondary | ICD-10-CM

## 2021-10-09 ENCOUNTER — Other Ambulatory Visit: Payer: Self-pay | Admitting: Physician Assistant

## 2021-10-09 ENCOUNTER — Encounter: Payer: Self-pay | Admitting: Physician Assistant

## 2021-10-09 DIAGNOSIS — K8010 Calculus of gallbladder with chronic cholecystitis without obstruction: Secondary | ICD-10-CM

## 2021-10-09 DIAGNOSIS — R1010 Upper abdominal pain, unspecified: Secondary | ICD-10-CM

## 2021-10-10 ENCOUNTER — Other Ambulatory Visit: Payer: Self-pay | Admitting: Physician Assistant

## 2021-10-10 DIAGNOSIS — R011 Cardiac murmur, unspecified: Secondary | ICD-10-CM

## 2021-10-11 NOTE — Telephone Encounter (Signed)
Patient states she will have her Mom-Tammy or Mom's Boyfriend Greig Castilla will come to office to pick syringes and needles.  Patient requests to be called at ph# [phone] when syringes and needles are ready to be picked up.

## 2021-10-11 NOTE — Telephone Encounter (Signed)
Please advise? This needs to be provided by pharmacy correct?

## 2021-10-12 ENCOUNTER — Telehealth: Payer: Self-pay | Admitting: Physician Assistant

## 2021-10-12 NOTE — Telephone Encounter (Signed)
Called pt and lvm giving pt information that our office unfortunately cannot provide needles and syringes to patients, this would need to be picked up at pt pharmacy. Advised cb number with any questions or concerns

## 2021-10-12 NOTE — Telephone Encounter (Signed)
Patient states: - She received VM from [facility] stating that PCP could not offer needles and syringes.  - She is confused why this was offered when they don't do this  Patient requests: - A callback for further discussion and understanding on subject

## 2021-10-13 NOTE — Telephone Encounter (Signed)
Spoke with patient and apologized she was incorrectly informed on supplies being given in the office per provider; pt also stated she got the ECHO scheduled and has no further questions or concerns.

## 2021-10-13 NOTE — Telephone Encounter (Signed)
Returned pt call and confirmed ECHO has been scheduled.

## 2021-10-16 ENCOUNTER — Ambulatory Visit (HOSPITAL_COMMUNITY): Payer: 59 | Attending: Physician Assistant

## 2021-10-16 ENCOUNTER — Other Ambulatory Visit: Payer: Self-pay

## 2021-10-16 DIAGNOSIS — R011 Cardiac murmur, unspecified: Secondary | ICD-10-CM | POA: Insufficient documentation

## 2021-10-16 LAB — ECHOCARDIOGRAM COMPLETE
Area-P 1/2: 3.85 cm2
S' Lateral: 3.3 cm

## 2021-10-16 NOTE — Telephone Encounter (Signed)
Patient states: -She was able to pick up B12 medication as well as syringes, but not the needles - She was informed by pharmacy that a prescription for needles only would be needed in order for these to be given    Patient requests: -A rx for needles needed for B12 injections be sent to [facility] at [address] in [place]

## 2021-10-17 ENCOUNTER — Other Ambulatory Visit: Payer: Self-pay

## 2021-10-17 ENCOUNTER — Ambulatory Visit: Payer: 59 | Admitting: Physician Assistant

## 2021-10-17 MED ORDER — "BD DISP NEEDLES 25G X 5/8"" MISC": 0 refills | Status: DC

## 2021-10-17 NOTE — Telephone Encounter (Signed)
Needles rx was sent in to the pharmacy separate for B-12 injections

## 2021-10-18 ENCOUNTER — Encounter: Payer: Self-pay | Admitting: Physician Assistant

## 2021-10-23 ENCOUNTER — Other Ambulatory Visit: Payer: Self-pay

## 2021-10-23 ENCOUNTER — Encounter (HOSPITAL_COMMUNITY): Payer: Self-pay

## 2021-10-23 ENCOUNTER — Emergency Department (HOSPITAL_COMMUNITY): Payer: 59

## 2021-10-23 ENCOUNTER — Observation Stay (HOSPITAL_COMMUNITY)
Admission: EM | Admit: 2021-10-23 | Discharge: 2021-10-25 | Disposition: A | Discharge status: Home / Self Care | Payer: 59 | Attending: Surgery | Admitting: Surgery

## 2021-10-23 DIAGNOSIS — K8010 Calculus of gallbladder with chronic cholecystitis without obstruction: Principal | ICD-10-CM | POA: Insufficient documentation

## 2021-10-23 DIAGNOSIS — Z79899 Other long term (current) drug therapy: Secondary | ICD-10-CM | POA: Diagnosis not present

## 2021-10-23 DIAGNOSIS — K8050 Calculus of bile duct without cholangitis or cholecystitis without obstruction: Secondary | ICD-10-CM

## 2021-10-23 DIAGNOSIS — R1011 Right upper quadrant pain: Secondary | ICD-10-CM | POA: Diagnosis present

## 2021-10-23 DIAGNOSIS — K8020 Calculus of gallbladder without cholecystitis without obstruction: Secondary | ICD-10-CM

## 2021-10-23 LAB — CBC WITH DIFFERENTIAL/PLATELET
Abs Immature Granulocytes: 0.04 10*3/uL (ref 0.00–0.07)
Basophils Absolute: 0 10*3/uL (ref 0.0–0.1)
Basophils Relative: 0 %
Eosinophils Absolute: 0.2 10*3/uL (ref 0.0–0.5)
Eosinophils Relative: 2 %
HCT: 40.8 % (ref 36.0–46.0)
Hemoglobin: 13.6 g/dL (ref 12.0–15.0)
Immature Granulocytes: 1 %
Lymphocytes Relative: 26 %
Lymphs Abs: 2.2 10*3/uL (ref 0.7–4.0)
MCH: 28.8 pg (ref 26.0–34.0)
MCHC: 33.3 g/dL (ref 30.0–36.0)
MCV: 86.4 fL (ref 80.0–100.0)
Monocytes Absolute: 0.5 10*3/uL (ref 0.1–1.0)
Monocytes Relative: 6 %
Neutro Abs: 5.4 10*3/uL (ref 1.7–7.7)
Neutrophils Relative %: 65 %
Platelets: 243 10*3/uL (ref 150–400)
RBC: 4.72 MIL/uL (ref 3.87–5.11)
RDW: 13 % (ref 11.5–15.5)
WBC: 8.4 10*3/uL (ref 4.0–10.5)
nRBC: 0 % (ref 0.0–0.2)

## 2021-10-23 LAB — COMPREHENSIVE METABOLIC PANEL
ALT: 32 U/L (ref 0–44)
AST: 17 U/L (ref 15–41)
Albumin: 3.8 g/dL (ref 3.5–5.0)
Alkaline Phosphatase: 66 U/L (ref 38–126)
Anion gap: 10 (ref 5–15)
BUN: 5 mg/dL — ABNORMAL LOW (ref 6–20)
CO2: 26 mmol/L (ref 22–32)
Calcium: 9.4 mg/dL (ref 8.9–10.3)
Chloride: 104 mmol/L (ref 98–111)
Creatinine, Ser: 0.74 mg/dL (ref 0.44–1.00)
GFR, Estimated: >60 mL/min (ref >60)
Glucose, Bld: 88 mg/dL (ref 70–99)
Potassium: 3.6 mmol/L (ref 3.5–5.1)
Sodium: 140 mmol/L (ref 135–145)
Total Bilirubin: 0.5 mg/dL (ref 0.3–1.2)
Total Protein: 6.9 g/dL (ref 6.5–8.1)

## 2021-10-23 LAB — URINALYSIS, ROUTINE W REFLEX MICROSCOPIC
Bilirubin Urine: NEGATIVE
Glucose, UA: NEGATIVE mg/dL
Hgb urine dipstick: NEGATIVE
Ketones, ur: NEGATIVE mg/dL
Leukocytes,Ua: NEGATIVE
Nitrite: NEGATIVE
Protein, ur: NEGATIVE mg/dL
Specific Gravity, Urine: 1.014 (ref 1.005–1.030)
pH: 7 (ref 5.0–8.0)

## 2021-10-23 LAB — I-STAT BETA HCG BLOOD, ED (MC, WL, AP ONLY): I-stat hCG, quantitative: <5 m[IU]/mL (ref <5)

## 2021-10-23 LAB — HIV ANTIBODY (ROUTINE TESTING W REFLEX): HIV Screen 4th Generation wRfx: NONREACTIVE

## 2021-10-23 LAB — LIPASE, BLOOD: Lipase: 25 U/L (ref 11–51)

## 2021-10-23 MED ORDER — ONDANSETRON 4 MG PO TBDP: 4.0000 mg | ORAL_TABLET | Freq: Four times a day (QID) | ORAL | Status: DC | PRN

## 2021-10-23 MED ORDER — DIPHENHYDRAMINE HCL 50 MG/ML IJ SOLN: 25.0000 mg | Freq: Four times a day (QID) | INTRAMUSCULAR | Status: DC | PRN

## 2021-10-23 MED ORDER — MELATONIN 3 MG PO TABS: 3.0000 mg | ORAL_TABLET | Freq: Every evening | ORAL | Status: DC | PRN

## 2021-10-23 MED ORDER — METOPROLOL TARTRATE 5 MG/5ML IV SOLN: 5.0000 mg | Freq: Four times a day (QID) | INTRAVENOUS | Status: DC | PRN

## 2021-10-23 MED ORDER — ENOXAPARIN SODIUM 40 MG/0.4ML IJ SOSY: 40.0000 mg | PREFILLED_SYRINGE | Freq: Every day | INTRAMUSCULAR | Status: DC

## 2021-10-23 MED ORDER — ENOXAPARIN SODIUM 40 MG/0.4ML IJ SOSY
40.0000 mg | PREFILLED_SYRINGE | Freq: Every day | INTRAMUSCULAR | Status: DC
  Administered 2021-10-23: 40 mg via SUBCUTANEOUS
  Filled 2021-10-23 (×2): qty 0.4

## 2021-10-23 MED ORDER — ACETAMINOPHEN 500 MG PO TABS
1000.0000 mg | ORAL_TABLET | Freq: Four times a day (QID) | ORAL | Status: DC
  Administered 2021-10-23 – 2021-10-25 (×7): 1000 mg via ORAL
  Filled 2021-10-23 (×7): qty 2

## 2021-10-23 MED ORDER — SODIUM CHLORIDE 0.9 % IV SOLN
2.0000 g | INTRAVENOUS | Status: DC
  Administered 2021-10-23: 2 g via INTRAVENOUS
  Filled 2021-10-23: qty 20

## 2021-10-23 MED ORDER — DIPHENHYDRAMINE HCL 25 MG PO CAPS: 25.0000 mg | ORAL_CAPSULE | Freq: Four times a day (QID) | ORAL | Status: DC | PRN

## 2021-10-23 MED ORDER — PANTOPRAZOLE SODIUM 40 MG PO TBEC
40.0000 mg | DELAYED_RELEASE_TABLET | Freq: Every day | ORAL | Status: DC
  Administered 2021-10-23 – 2021-10-25 (×2): 40 mg via ORAL
  Filled 2021-10-23 (×2): qty 1

## 2021-10-23 MED ORDER — TRAMADOL HCL 50 MG PO TABS
50.0000 mg | ORAL_TABLET | Freq: Four times a day (QID) | ORAL | Status: DC | PRN
  Administered 2021-10-23 – 2021-10-25 (×4): 50 mg via ORAL
  Filled 2021-10-23 (×4): qty 1

## 2021-10-23 MED ORDER — ONDANSETRON HCL 4 MG/2ML IJ SOLN
4.0000 mg | Freq: Four times a day (QID) | INTRAMUSCULAR | Status: DC | PRN
  Administered 2021-10-23 – 2021-10-24 (×2): 4 mg via INTRAVENOUS
  Filled 2021-10-23 (×2): qty 2

## 2021-10-23 MED ORDER — KCL IN DEXTROSE-NACL 20-5-0.45 MEQ/L-%-% IV SOLN
INTRAVENOUS | Status: DC
  Filled 2021-10-23 (×4): qty 1000

## 2021-10-23 MED ORDER — SODIUM CHLORIDE 0.9 % IV SOLN: 2.0000 g | INTRAVENOUS | Status: DC

## 2021-10-23 MED ORDER — SIMETHICONE 80 MG PO CHEW
40.0000 mg | CHEWABLE_TABLET | Freq: Four times a day (QID) | ORAL | Status: DC | PRN
  Administered 2021-10-25: 40 mg via ORAL
  Filled 2021-10-23 (×2): qty 1

## 2021-10-23 NOTE — ED Notes (Signed)
ED TO INPATIENT HANDOFF REPORT  ED Nurse Name and Phone #: [phone] [facility] Name/Age/Gender Rebecca Schroeder 31 y.o. female Room/Bed: 016C/016C  Code Status   Code Status: Full Code  Home/SNF/Other Home Patient oriented to: self, place, time, and situation Is this baseline? Yes   Triage Complete: Triage complete  Chief Complaint Biliary colic [id number]  Triage Note Reports hx of gallstones, was suppose to see surgeon this am.  Patient reported an attack last night with sharp RUQ pain and upper back pain.  Denies n/v.  Last attack was Friday.    Allergies Allergies  Allergen Reactions   Hydrocodone Nausea And Vomiting   Penicillins Other (See Comments) and Rash    Unknown Has patient had a PCN reaction causing immediate rash, facial/tongue/throat swelling, SOB or lightheadedness with hypotension: Unknown Has patient had a PCN reaction causing severe rash involving mucus membranes or skin necrosis: Unknown Has patient had a PCN reaction that required hospitalization: Unknown Has patient had a PCN reaction occurring within the last 10 years: Unknown If all of the above answers are "NO", then may proceed with Cephalosporin use. Childhood reaction.   Codeine Nausea And Vomiting   Hydrocodone-Acetaminophen Nausea And Vomiting   Roxicodone [Oxycodone Hcl] Nausea And Vomiting and Other (See Comments)    SEVERE VOMITING----Patient DOES NOT tolerate with anti nausea medications    Level of Care/Admitting Diagnosis ED Disposition     ED Disposition  Admit   Condition  --   Comment  Hospital Area: [facility] [[id number]]  Level of Care: Med-Surg [16]  May place patient in observation at [facility] or [facility] if equivalent level of care is available:: No  Covid Evaluation: Asymptomatic - no recent exposure (last 10 days) testing not required  Diagnosis: Biliary colic [id number]  Admitting Physician: [place], [place]  Attending Physician: [facility], MD [place]   Bed request comments: 6N          B Medical/Surgery History Past Medical History:  Diagnosis Date   Abnormality in fetal heart rate and rhythm complicating labor and delivery 07/30/2014   Anxiety    hx PP anxiety   Back pain    occasional that radiates down leg   Non-reassuring electronic fetal monitoring tracing 07/30/2014   Preterm labor [id number]   Single umbilical artery [id number]   Supervision of normal pregnancy 06/18/2014   Past Surgical History:  Procedure Laterality Date   CESAREAN SECTION     CESAREAN SECTION N/A 04/10/2017   Procedure: REPEAT CESAREAN SECTION;  Surgeon: Everlene Farrier, MD;  Location: [facility];  Service: Obstetrics;  Laterality: N/A;  Repeat edc 04/17/17 allergy to codeine, hydrocodone, PCN need RNFA.Marland KitchenMarland KitchenMarland KitchenClaretta Fraise RNFA   TOE SURGERY     WISDOM TOOTH EXTRACTION       A IV Location/Drains/Wounds Patient Lines/Drains/Airways Status     Active Line/Drains/Airways     Name Placement date Placement time Site Days   Peripheral IV 10/23/21 20 G Upper;Left Arm 10/23/21  1148  Arm  less than 1   Incision (Closed) 04/10/17 Abdomen 04/10/17  0814  -- 1657   Incision (Closed) 04/10/17 Perineum 04/10/17  0819  -- 1657            Intake/Output Last 24 hours No intake or output data in the 24 hours ending 10/23/21 1234  Labs/Imaging Results for orders placed or performed during the hospital encounter of 10/23/21 (from the past 48 hour(s))  CBC with Differential  Status: None   Collection Time: 10/23/21  9:10 AM  Result Value Ref Range   WBC 8.4 4.0 - 10.5 K/uL   RBC 4.72 3.87 - 5.11 MIL/uL   Hemoglobin 13.6 12.0 - 15.0 g/dL   HCT 40.8 36.0 - 46.0 %   MCV 86.4 80.0 - 100.0 fL   MCH 28.8 26.0 - 34.0 pg   MCHC 33.3 30.0 - 36.0 g/dL   RDW 13.0 11.5 - 15.5 %   Platelets 243 150 - 400 K/uL   nRBC 0.0 0.0 - 0.2 %   Neutrophils Relative % 65 %   Neutro Abs 5.4 1.7 - 7.7 K/uL   Lymphocytes Relative 26 %   Lymphs Abs 2.2 0.7 -  4.0 K/uL   Monocytes Relative 6 %   Monocytes Absolute 0.5 0.1 - 1.0 K/uL   Eosinophils Relative 2 %   Eosinophils Absolute 0.2 0.0 - 0.5 K/uL   Basophils Relative 0 %   Basophils Absolute 0.0 0.0 - 0.1 K/uL   Immature Granulocytes 1 %   Abs Immature Granulocytes 0.04 0.00 - 0.07 K/uL    Comment: Performed at [facility], [address] [place], [place] [address]  Comprehensive metabolic panel     Status: Abnormal   Collection Time: 10/23/21  9:10 AM  Result Value Ref Range   Sodium 140 135 - 145 mmol/L   Potassium 3.6 3.5 - 5.1 mmol/L   Chloride 104 98 - 111 mmol/L   CO2 26 22 - 32 mmol/L   Glucose, Bld 88 70 - 99 mg/dL    Comment: Glucose reference range applies only to samples taken after fasting for at least 8 hours.   BUN 5 (L) 6 - 20 mg/dL   Creatinine, Ser 0.74 0.44 - 1.00 mg/dL   Calcium 9.4 8.9 - 10.3 mg/dL   Total Protein 6.9 6.5 - 8.1 g/dL   Albumin 3.8 3.5 - 5.0 g/dL   AST 17 15 - 41 U/L   ALT 32 0 - 44 U/L   Alkaline Phosphatase 66 38 - 126 U/L   Total Bilirubin 0.5 0.3 - 1.2 mg/dL   GFR, Estimated >60 >60 mL/min    Comment: (NOTE) Calculated using the CKD-EPI Creatinine Equation (2021)    Anion gap 10 5 - 15    Comment: Performed at [facility] [address], [place], [place] [address]  Lipase, blood     Status: None   Collection Time: 10/23/21  9:10 AM  Result Value Ref Range   Lipase 25 11 - 51 U/L    Comment: Performed at [facility] [address] [place],  [address]  I-Stat beta hCG blood, ED     Status: None   Collection Time: 10/23/21  9:20 AM  Result Value Ref Range   I-stat hCG, quantitative <5.0 <5 mIU/mL   Comment 3            Comment:   GEST. AGE      CONC.  (mIU/mL)   <=1 WEEK        5 - 50     2 WEEKS       50 - 500     3 WEEKS       100 - 10,000     4 WEEKS     1,000 - 30,000        FEMALE AND NON-PREGNANT FEMALE:     LESS THAN 5 mIU/mL   Urinalysis, Routine w reflex  microscopic Urine, Clean Catch      Status: Abnormal   Collection Time: 10/23/21 10:04 AM  Result Value Ref Range   Color, Urine YELLOW YELLOW   APPearance HAZY (A) CLEAR   Specific Gravity, Urine 1.014 1.005 - 1.030   pH 7.0 5.0 - 8.0   Glucose, UA NEGATIVE NEGATIVE mg/dL   Hgb urine dipstick NEGATIVE NEGATIVE   Bilirubin Urine NEGATIVE NEGATIVE   Ketones, ur NEGATIVE NEGATIVE mg/dL   Protein, ur NEGATIVE NEGATIVE mg/dL   Nitrite NEGATIVE NEGATIVE   Leukocytes,Ua NEGATIVE NEGATIVE    Comment: Performed at [facility] [address] [place], [place] [address]   US Abdomen Limited RUQ (LIVER/GB)  Result Date: 10/23/2021 CLINICAL DATA:  31 year old female with right upper quadrant pain. Gallstones. EXAM: ULTRASOUND ABDOMEN LIMITED RIGHT UPPER QUADRANT COMPARISON:  Abdomen ultrasound 10/06/2021. FINDINGS: Gallbladder: Similar wall-echo-shadow (WES) sign suggesting a gallbladder contracted around numerous stones. Distinct shadowing echogenic stone measuring 10 mm diameter on image 7. Evidence of gallbladder wall thickening, 4-5 mm. But no sonographic Murphy sign elicited. Common bile duct: Diameter: 5 mm, normal. Liver: No focal lesion identified. Within normal limits in parenchymal echogenicity. Portal vein is patent on color Doppler imaging with normal direction of blood flow towards the liver. Other: Negative visible right kidney.  No free fluid IMPRESSION: 1. Continued appearance of abnormally thickened gallbladder contracted around multiple gallstones. Cannot exclude acute cholecystitis, but no sonographic Murphy sign was elicited. 2. No evidence of bile duct obstruction. Negative ultrasound appearance of the liver. Electronically Signed   By: Genevie Ann M.D.   On: 10/23/2021 10:02    Pending Labs Unresulted Labs (From admission, onward)     Start     Ordered   10/23/21 1220  HIV Antibody (routine testing w rflx)  (HIV Antibody (Routine testing w reflex) panel)  Once,   R        10/23/21 1223             Vitals/Pain Today's Vitals   10/23/21 1100 10/23/21 1115 10/23/21 1130 10/23/21 1200  BP: 136/82 120/62 105/76 110/75  Pulse: 70 67 72 68  Resp: 17 20 (!) 26 19  Temp:      TempSrc:      SpO2: 100% 100% 99% 98%  Weight:      Height:      PainSc:        Isolation Precautions No active isolations  Medications Medications  pantoprazole (PROTONIX) EC tablet 40 mg (has no administration in time range)  enoxaparin (LOVENOX) injection 40 mg (has no administration in time range)  dextrose 5 % and 0.45 % NaCl with KCl 20 mEq/L infusion (has no administration in time range)  cefTRIAXone (ROCEPHIN) 2 g in sodium chloride 0.9 % 100 mL IVPB (has no administration in time range)  acetaminophen (TYLENOL) tablet 1,000 mg (has no administration in time range)  traMADol (ULTRAM) tablet 50 mg (has no administration in time range)  melatonin tablet 3 mg (has no administration in time range)  diphenhydrAMINE (BENADRYL) capsule 25 mg (has no administration in time range)    Or  diphenhydrAMINE (BENADRYL) injection 25 mg (has no administration in time range)  ondansetron (ZOFRAN-ODT) disintegrating tablet 4 mg (has no administration in time range)    Or  ondansetron (ZOFRAN) injection 4 mg (has no administration in time range)  simethicone (MYLICON) chewable tablet 40 mg (has no administration in time range)  metoprolol tartrate (LOPRESSOR) injection 5 mg (has no administration in  time range)    Mobility walks Low fall risk   Focused Assessments LBM  10-19-21 (pt reports due to not drinking coffee daily), not passing gas, but is burping. Last intake was 10-22-21 at 1800 for fluids and solids, RUQ pain and tenderness to palpation and bilateral lower abd soreness. Pt also c/o lower back aching. Pt A&Ox4. 20g L upper arm saline locked.    R Recommendations: See Admitting Provider Note  Report given to:   Additional Notes:

## 2021-10-23 NOTE — Discharge Instructions (Signed)
[facility] ° °Please arrive at least 30 min before your appointment to complete your check in paperwork.  If you are unable to arrive 30 min prior to your appointment time we may have to cancel or reschedule you. °LAPAROSCOPIC SURGERY: POST OP INSTRUCTIONS °Always review your discharge instruction sheet given to you by the facility where your surgery was performed. °IF YOU HAVE DISABILITY OR FAMILY LEAVE FORMS, YOU MUST BRING THEM TO THE OFFICE FOR PROCESSING.   °DO NOT GIVE THEM TO YOUR DOCTOR. ° °PAIN CONTROL ° °First take acetaminophen (Tylenol) AND/or ibuprofen (Advil) to control your pain after surgery.  Follow directions on package.  Taking acetaminophen (Tylenol) and/or ibuprofen (Advil) regularly after surgery will help to control your pain and lower the amount of prescription pain medication you may need.  You should not take more than 4,000 mg (4 grams) of acetaminophen (Tylenol) in 24 hours.  You should not take ibuprofen (Advil), aleve, motrin, naprosyn or other NSAIDS if you have a history of stomach ulcers or chronic kidney disease.  °A prescription for pain medication may be given to you upon discharge.  Take your pain medication as prescribed, if you still have uncontrolled pain after taking acetaminophen (Tylenol) or ibuprofen (Advil). °Use ice packs to help control pain. °If you need a refill on your pain medication, please contact your pharmacy.  They will contact our office to request authorization. Prescriptions will not be filled after 5pm or on week-ends. ° °HOME MEDICATIONS °Take your usually prescribed medications unless otherwise directed. ° °DIET °You should follow a light diet the first few days after arrival home.  Be sure to include lots of fluids daily. Avoid fatty, fried foods.  ° °CONSTIPATION °It is common to experience some constipation after surgery and if you are taking pain medication.  Increasing fluid intake and taking a stool softener (such as Colace)  will usually help or prevent this problem from occurring.  A mild laxative (Milk of Magnesia or Miralax) should be taken according to package instructions if there are no bowel movements after 48 hours. ° °WOUND/INCISION CARE °Most patients will experience some swelling and bruising in the area of the incisions.  Ice packs will help.  Swelling and bruising can take several days to resolve.  °Unless discharge instructions indicate otherwise, follow guidelines below  °STERI-STRIPS - you may remove your outer bandages 48 hours after surgery, and you may shower at that time.  You have steri-strips (small skin tapes) in place directly over the incision.  These strips should be left on the skin for 7-10 days.   °DERMABOND/SKIN GLUE - you may shower in 24 hours.  The glue will flake off over the next 2-3 weeks. °Any sutures or staples will be removed at the office during your follow-up visit. ° °ACTIVITIES °You may resume regular (light) daily activities beginning the next day--such as daily self-care, walking, climbing stairs--gradually increasing activities as tolerated.  You may have sexual intercourse when it is comfortable.  Refrain from any heavy lifting or straining until approved by your doctor. °You may drive when you are no longer taking prescription pain medication, you can comfortably wear a seatbelt, and you can safely maneuver your car and apply brakes. ° °FOLLOW-UP °You should see your doctor in the office for a follow-up appointment approximately 2-3 weeks after your surgery.  You should have been given your post-op/follow-up appointment when your surgery was scheduled.  If you did not receive a post-op/follow-up appointment, make sure   that you call for this appointment within a day or two after you arrive home to insure a convenient appointment time. ° ° °WHEN TO CALL YOUR DOCTOR: °Fever over 101.0 °Inability to urinate °Continued bleeding from incision. °Increased pain, redness, or drainage from the  incision. °Increasing abdominal pain ° °The clinic staff is available to answer your questions during regular business hours.  Please don’t hesitate to call and ask to speak to one of the nurses for clinical concerns.  If you have a medical emergency, go to the nearest emergency room or call 911.  A surgeon from [facility] is always on call at the hospital. °[address], [address], [place], [place]  [address] ? [address] [address], [place], [place]   [address] °[phone] ? [phone] ? FAX [phone] ° ° ° ° °Managing Your Pain After Surgery Without Opioids ° ° ° °Thank you for participating in our program to help patients manage their pain after surgery without opioids. This is part of our effort to provide you with the best care possible, without exposing you or your family to the risk that opioids pose. ° °What pain can I expect after surgery? °You can expect to have some pain after surgery. This is normal. The pain is typically worse the day after surgery, and quickly begins to get better. °Many studies have found that many patients are able to manage their pain after surgery with Over-the-Counter (OTC) medications such as Tylenol and Motrin. If you have a condition that does not allow you to take Tylenol or Motrin, notify your surgical team. ° °How will I manage my pain? °The best strategy for controlling your pain after surgery is around the clock pain control with Tylenol (acetaminophen) and Motrin (ibuprofen or Advil). Alternating these medications with each other allows you to maximize your pain control. In addition to Tylenol and Motrin, you can use heating pads or ice packs on your incisions to help reduce your pain. ° °How will I alternate your regular strength over-the-counter pain medication? °You will take a dose of pain medication every three hours. °Start by taking 650 mg of Tylenol (2 pills of 325 mg) °3 hours later take 600 mg of Motrin (3 pills of 200 mg) °3 hours after  taking the Motrin take 650 mg of Tylenol °3 hours after that take 600 mg of Motrin. ° ° °- 1 - ° °See example - if your first dose of Tylenol is at 12:00 PM ° ° °12:00 PM Tylenol 650 mg (2 pills of 325 mg)  °3:00 PM Motrin 600 mg (3 pills of 200 mg)  °6:00 PM Tylenol 650 mg (2 pills of 325 mg)  °9:00 PM Motrin 600 mg (3 pills of 200 mg)  °Continue alternating every 3 hours  ° °We recommend that you follow this schedule around-the-clock for at least 3 days after surgery, or until you feel that it is no longer needed. Use the table on the last page of this handout to keep track of the medications you are taking. °Important: °Do not take more than 3000mg of Tylenol or 3200mg of Motrin in a 24-hour period. °Do not take ibuprofen/Motrin if you have a history of bleeding stomach ulcers, severe kidney disease, &/or actively taking a blood thinner ° °What if I still have pain? °If you have pain that is not controlled with the over-the-counter pain medications (Tylenol and Motrin or Advil) you might have what we call “breakthrough” pain. You will receive a prescription   for a small amount of an opioid pain medication such as Oxycodone, Tramadol, or Tylenol with Codeine. Use these opioid pills in the first 24 hours after surgery if you have breakthrough pain. Do not take more than 1 pill every 4-6 hours. ° °If you still have uncontrolled pain after using all opioid pills, don't hesitate to call our staff using the number provided. We will help make sure you are managing your pain in the best way possible, and if necessary, we can provide a prescription for additional pain medication. ° ° °Day 1   ° °Time  °Name of Medication Number of pills taken  °Amount of Acetaminophen  °Pain Level  ° °Comments  °AM PM       °AM PM       °AM PM       °AM PM       °AM PM       °AM PM       °AM PM       °AM PM       °Total Daily amount of Acetaminophen °Do not take more than  3,000 mg per day    ° ° °Day 2   ° °Time  °Name of Medication  Number of pills °taken  °Amount of Acetaminophen  °Pain Level  ° °Comments  °AM PM       °AM PM       °AM PM       °AM PM       °AM PM       °AM PM       °AM PM       °AM PM       °Total Daily amount of Acetaminophen °Do not take more than  3,000 mg per day    ° ° °Day 3   ° °Time  °Name of Medication Number of pills taken  °Amount of Acetaminophen  °Pain Level  ° °Comments  °AM PM       °AM PM       °AM PM       °AM PM       ° ° ° °AM PM       °AM PM       °AM PM       °AM PM       °Total Daily amount of Acetaminophen °Do not take more than  3,000 mg per day    ° ° °Day 4   ° °Time  °Name of Medication Number of pills taken  °Amount of Acetaminophen  °Pain Level  ° °Comments  °AM PM       °AM PM       °AM PM       °AM PM       °AM PM       °AM PM       °AM PM       °AM PM       °Total Daily amount of Acetaminophen °Do not take more than  3,000 mg per day    ° ° °Day 5   ° °Time  °Name of Medication Number °of pills taken  °Amount of Acetaminophen  °Pain Level  ° °Comments  °AM PM       °AM PM       °AM PM       °AM PM       °AM PM       °AM   PM       °AM PM       °AM PM       °Total Daily amount of Acetaminophen °Do not take more than  3,000 mg per day    ° ° ° °Day 6   ° °Time  °Name of Medication Number of pills °taken  °Amount of Acetaminophen  °Pain Level  °Comments  °AM PM       °AM PM       °AM PM       °AM PM       °AM PM       °AM PM       °AM PM       °AM PM       °Total Daily amount of Acetaminophen °Do not take more than  3,000 mg per day    ° ° °Day 7   ° °Time  °Name of Medication Number of pills taken  °Amount of Acetaminophen  °Pain Level  ° °Comments  °AM PM       °AM PM       °AM PM       °AM PM       °AM PM       °AM PM       °AM PM       °AM PM       °Total Daily amount of Acetaminophen °Do not take more than  3,000 mg per day    ° ° ° ° °For additional information about how and where to safely dispose of unused opioid °medications - https://www.morepowerfulnc.org ° °Disclaimer: This document  contains information and/or instructional materials adapted from [facility] for the typical patient with your condition. It does not replace medical advice from your health care provider because your experience may differ from that of the °typical patient. Talk to your health care provider if you have any questions about this °document, your condition or your treatment plan. °Adapted from [facility] ° °

## 2021-10-23 NOTE — ED Triage Notes (Signed)
Reports hx of gallstones, was suppose to see surgeon this am.  Patient reported an attack last night with sharp RUQ pain and upper back pain.  Denies n/v.  Last attack was Friday.

## 2021-10-23 NOTE — ED Notes (Signed)
General Sx provider at bedside

## 2021-10-23 NOTE — ED Provider Triage Note (Signed)
Emergency Medicine Provider Triage Evaluation Note  Rebecca Schroeder , a 31 y.o. female  was evaluated in triage.  Pt complains of right upper quadrant pain and abdominal pain.  She has had attacks of pain that she relates to gallstones.  Diagnosed on ultrasound 10/06/2021.  Attack last night ended around 4 AM, however she continued to have aching pain in the right upper quadrant with radiation to the back.  No vomiting but she has been nauseous.  She reports associated constipation.  Review of Systems  Positive: Abdominal pain Negative: Vomiting  Physical Exam  BP (!) 156/101 (BP Location: Right Arm)   Pulse 86   Temp 98.3 F (36.8 C)   Resp 16   Ht 5\' 2"  (1.575 m)   Wt 93.4 kg   SpO2 100%   BMI 37.68 kg/m  Gen:   Awake, no distress   Resp:  Normal effort  MSK:   Moves extremities without difficulty  Other:  Mild right upper quadrant tenderness without rebound or guarding  Medical Decision Making  Medically screening exam initiated at 9:07 AM.  Appropriate orders placed.  Rebecca Schroeder was informed that the remainder of the evaluation will be completed by another provider, this initial triage assessment does not replace that evaluation, and the importance of remaining in the ED until their evaluation is complete.     Carlisle Cater, PA-C 10/23/21 [phone]

## 2021-10-23 NOTE — H&P (Signed)
History and Physical  Rebecca Schroeder November 18, 1990  [id number].    Requesting MD: Dr. Alvino Chapel Chief Complaint/Reason for Consult: symptomatic cholelithiasis  HPI:  31 y.o. female with medical history significant for history of cholelithiasis and prior cesarean sections (x2) who presented to [facility] ED with abdominal pain. She has a history of several gallstone attacks in the last year and was supposed to see Dr. Rosendo Gros in our clinic today. She had an episode of abdominal pain Friday consistent with prior gallbladder pain. Her appetite has been low since then. Pain came back in greater severity last night/early this morning. She ate small amount of mac and cheese for dinner and nothing to eat or drink since then. She has had intermittent nausea but no emesis. No fever or chills.  Work up in ED significant for [place] with thickened gallbladder and multiple gallstones.   ROS: Review of Systems  Constitutional:  Negative for chills and fever.  Respiratory:  Negative for cough and wheezing.   Cardiovascular:  Negative for chest pain and palpitations.  Gastrointestinal:  Positive for abdominal pain and nausea. Negative for constipation, diarrhea and vomiting.    Family History  Problem Relation Age of Onset   Depression Mother    Thyroid disease Mother    Alcohol abuse Father    Hypertension Maternal Grandfather    Diabetes Maternal Grandfather    Prostate cancer Maternal Grandfather     Past Medical History:  Diagnosis Date   Abnormality in fetal heart rate and rhythm complicating labor and delivery 07/30/2014   Anxiety    hx PP anxiety   Back pain    occasional that radiates down leg   Non-reassuring electronic fetal monitoring tracing 07/30/2014   Preterm labor 4/76/5465   Single umbilical artery 0/35/4656   Supervision of normal pregnancy 06/18/2014    Past Surgical History:  Procedure Laterality Date   CESAREAN SECTION     CESAREAN SECTION N/A 04/10/2017   Procedure:  REPEAT CESAREAN SECTION;  Surgeon: Everlene Farrier, MD;  Location: [place];  Service: Obstetrics;  Laterality: N/A;  Repeat edc 04/17/17 allergy to codeine, hydrocodone, PCN need RNFA.Marland KitchenMarland KitchenMarland KitchenClaretta Fraise RNFA   TOE SURGERY     WISDOM TOOTH EXTRACTION      Social History:  reports that she has never smoked. She has never used smokeless tobacco. She reports current alcohol use. She reports that she does not use drugs.  Allergies:  Allergies  Allergen Reactions   Hydrocodone Nausea And Vomiting   Penicillins Other (See Comments) and Rash    Unknown Has patient had a PCN reaction causing immediate rash, facial/tongue/throat swelling, SOB or lightheadedness with hypotension: Unknown Has patient had a PCN reaction causing severe rash involving mucus membranes or skin necrosis: Unknown Has patient had a PCN reaction that required hospitalization: Unknown Has patient had a PCN reaction occurring within the last 10 years: Unknown If all of the above answers are "NO", then may proceed with Cephalosporin use. Childhood reaction.   Codeine Nausea And Vomiting   Hydrocodone-Acetaminophen Nausea And Vomiting   Roxicodone [Oxycodone Hcl] Nausea And Vomiting and Other (See Comments)    SEVERE VOMITING----Patient DOES NOT tolerate with anti nausea medications    (Not in a hospital admission)   Blood pressure 136/82, pulse 70, temperature 98.3 F (36.8 C), temperature source Oral, resp. rate 17, height _0  (1.575 m), weight 93.4 kg, SpO2 100 %. Physical Exam: General: pleasant, WD, female who is laying in bed in NAD  HEENT: head is normocephalic, atraumatic.  Sclera are noninjected.  Pupils equal and round. EOMs intact.  Ears and nose without any masses or lesions.  Mouth is pink and moist Heart: regular, rate, and rhythm.  Normal s1,s2. No obvious murmurs, gallops, or rubs noted.  Palpable radial and pedal pulses bilaterally Lungs: CTAB, no wheezes, rhonchi, or rales noted.   Respiratory effort nonlabored Abd: soft, ND, +BS, no masses, hernias, or organomegaly. Focal TTP in RUQ without rebound or guarding MSK: all 4 extremities are symmetrical with no cyanosis, clubbing, or edema. Skin: warm and dry with no masses, lesions, or rashes Psych: A&Ox3 with an appropriate affect.    Results for orders placed or performed during the hospital encounter of 10/23/21 (from the past 48 hour(s))  CBC with Differential     Status: None   Collection Time: 10/23/21  9:10 AM  Result Value Ref Range   WBC 8.4 4.0 - 10.5 K/uL   RBC 4.72 3.87 - 5.11 MIL/uL   Hemoglobin 13.6 12.0 - 15.0 g/dL   HCT 40.8 36.0 - 46.0 %   MCV 86.4 80.0 - 100.0 fL   MCH 28.8 26.0 - 34.0 pg   MCHC 33.3 30.0 - 36.0 g/dL   RDW 13.0 11.5 - 15.5 %   Platelets 243 150 - 400 K/uL   nRBC 0.0 0.0 - 0.2 %   Neutrophils Relative % 65 %   Neutro Abs 5.4 1.7 - 7.7 K/uL   Lymphocytes Relative 26 %   Lymphs Abs 2.2 0.7 - 4.0 K/uL   Monocytes Relative 6 %   Monocytes Absolute 0.5 0.1 - 1.0 K/uL   Eosinophils Relative 2 %   Eosinophils Absolute 0.2 0.0 - 0.5 K/uL   Basophils Relative 0 %   Basophils Absolute 0.0 0.0 - 0.1 K/uL   Immature Granulocytes 1 %   Abs Immature Granulocytes 0.04 0.00 - 0.07 K/uL    Comment: Performed at [facility], [address] [place], [place] [address]  Comprehensive metabolic panel     Status: Abnormal   Collection Time: 10/23/21  9:10 AM  Result Value Ref Range   Sodium 140 135 - 145 mmol/L   Potassium 3.6 3.5 - 5.1 mmol/L   Chloride 104 98 - 111 mmol/L   CO2 26 22 - 32 mmol/L   Glucose, Bld 88 70 - 99 mg/dL    Comment: Glucose reference range applies only to samples taken after fasting for at least 8 hours.   BUN 5 (L) 6 - 20 mg/dL   Creatinine, Ser 0.74 0.44 - 1.00 mg/dL   Calcium 9.4 8.9 - 10.3 mg/dL   Total Protein 6.9 6.5 - 8.1 g/dL   Albumin 3.8 3.5 - 5.0 g/dL   AST 17 15 - 41 U/L   ALT 32 0 - 44 U/L   Alkaline Phosphatase 66 38 - 126 U/L   Total  Bilirubin 0.5 0.3 - 1.2 mg/dL   GFR, Estimated >60 >60 mL/min    Comment: (NOTE) Calculated using the CKD-EPI Creatinine Equation (2021)    Anion gap 10 5 - 15    Comment: Performed at [facility] [address], [place], [place] [address]  Lipase, blood     Status: None   Collection Time: 10/23/21  9:10 AM  Result Value Ref Range   Lipase 25 11 - 51 U/L    Comment: Performed at [facility] [address], [place], [place] [address]  I-Stat beta hCG  blood, ED     Status: None   Collection Time: 10/23/21  9:20 AM  Result Value Ref Range   I-stat hCG, quantitative <5.0 <5 mIU/mL   Comment 3            Comment:   GEST. AGE      CONC.  (mIU/mL)   <=1 WEEK        5 - 50     2 WEEKS       50 - 500     3 WEEKS       100 - 10,000     4 WEEKS     1,000 - 30,000        FEMALE AND NON-PREGNANT FEMALE:     LESS THAN 5 mIU/mL   Urinalysis, Routine w reflex microscopic Urine, Clean Catch     Status: Abnormal   Collection Time: 10/23/21 10:04 AM  Result Value Ref Range   Color, Urine YELLOW YELLOW   APPearance HAZY (A) CLEAR   Specific Gravity, Urine 1.014 1.005 - 1.030   pH 7.0 5.0 - 8.0   Glucose, UA NEGATIVE NEGATIVE mg/dL   Hgb urine dipstick NEGATIVE NEGATIVE   Bilirubin Urine NEGATIVE NEGATIVE   Ketones, ur NEGATIVE NEGATIVE mg/dL   Protein, ur NEGATIVE NEGATIVE mg/dL   Nitrite NEGATIVE NEGATIVE   Leukocytes,Ua NEGATIVE NEGATIVE    Comment: Performed at [facility] [address] [place], [place] [address]   US Abdomen Limited RUQ (LIVER/GB)  Result Date: 10/23/2021 CLINICAL DATA:  31 year old female with right upper quadrant pain. Gallstones. EXAM: ULTRASOUND ABDOMEN LIMITED RIGHT UPPER QUADRANT COMPARISON:  Abdomen ultrasound 10/06/2021. FINDINGS: Gallbladder: Similar wall-echo-shadow (WES) sign suggesting a gallbladder contracted around numerous stones. Distinct shadowing echogenic stone measuring 10 mm diameter on image 7. Evidence of gallbladder wall  thickening, 4-5 mm. But no sonographic Murphy sign elicited. Common bile duct: Diameter: 5 mm, normal. Liver: No focal lesion identified. Within normal limits in parenchymal echogenicity. Portal vein is patent on color Doppler imaging with normal direction of blood flow towards the liver. Other: Negative visible right kidney.  No free fluid IMPRESSION: 1. Continued appearance of abnormally thickened gallbladder contracted around multiple gallstones. Cannot exclude acute cholecystitis, but no sonographic Murphy sign was elicited. 2. No evidence of bile duct obstruction. Negative ultrasound appearance of the liver. Electronically Signed   By: Genevie Ann M.D.   On: 10/23/2021 10:02      Assessment/Plan Symptomatic cholelithiasis Patient seen and examined and relevant labs and imaging reviewed which are concerning for symptomatic cholelithiasis. Recommend definitive management with laparoscopic cholecystectomy I have explained the procedure, risks, and aftercare of cholecystectomy.  Risks include but are not limited to bleeding, infection, wound problems, anesthesia, diarrhea, bile leak, injury to common bile duct/liver/intestine.  She seems to understand and agrees to proceed.  Will tentatively plan for lap chole today - OR time allowing vs admit for observation overnight and surgery in the am  FEN: NPO ID: preop [email or web address] VTE: none currently   I reviewed ED provider notes, last 24 h vitals and pain scores, last 48 h intake and output, last 24 h labs and trends, and last 24 h imaging results.   Winferd Humphrey, [facility] Surgery 10/23/2021, 12:05 PM Please see Amion for pager number during day hours 7:00am-4:30pm

## 2021-10-23 NOTE — ED Notes (Addendum)
Pt states back pain that is sore but not stabbing anymore more of an aching pain. Reports no stabbing pain in over 30 minutes. Also reports soreness in right upper quadrant and lower abdominal area. Reports she is also constipated. Approx. Last known BM was Thursday 9/14. No gas but states she did take dose of GasX last night.

## 2021-10-23 NOTE — ED Provider Notes (Signed)
[facility] EMERGENCY DEPARTMENT Provider Note   CSN: [id number] Arrival date & time: 10/23/21  0859     History  Chief Complaint  Patient presents with   Rebecca Schroeder    IRETTA MANGRUM is a 31 y.o. female.   Rebecca Schroeder Patient presents Rebecca Schroeder.  Right upper quadrant.  Has had for the last year on and off.  Has had attacks of it in the past.  Goes from right upper quadrant up to back.  States it almost feels if there is a hole in the back.  States had an ultrasound done that showed gallbladder issues.  Was due to see Dr. Rosendo Gros at 11 AM today.  Had worsening Schroeder last night.  Not necessarily associated with eating.  Does tend to hit her more at night.  Somewhat positional at times.  Is not eaten today.    Past Medical History:  Diagnosis Date   Abnormality in fetal heart rate and rhythm complicating labor and delivery 07/30/2014   Anxiety    hx PP anxiety   Back Schroeder    occasional that radiates down leg   Non-reassuring electronic fetal monitoring tracing 07/30/2014   Preterm labor 05/07/270   Single umbilical artery 5/36/6440   Supervision of normal pregnancy 06/18/2014    Home Medications Prior to Admission medications   Medication Sig Start Date End Date Taking? Authorizing Provider  cyanocobalamin (VITAMIN B12) 1000 MCG/ML injection INJECT VIT B12 INTO SKIN ONCE A DAY X 1 WEEK AND THEN ONCE A WEEK X 8 WEEKS. 10/10/21   Allwardt, Randa Evens, PA-C  etonogestrel-ethinyl estradiol (NUVARING) 0.12-0.015 MG/24HR vaginal ring INSERT 1 RING VAGINALLY. LEAVE IN FOR 3 WEEKS AND REMOVE FOR 1 WEEK Patient taking differently: Place 1 each vaginally every 28 (twenty-eight) days. INSERT 1 RING VAGINALLY. LEAVE IN FOR 3 WEEKS AND REMOVE FOR 1 WEEK 01/13/19   Alycia Rossetti, MD  etonogestrel-ethinyl estradiol (NUVARING) 0.12-0.015 MG/24HR vaginal ring INSERT 1 RING INTO THE VAGINA ONCE MONTHLY 02/17/20 02/16/21  Everlene Farrier, MD  NEEDLE, DISP, 25 G (BD DISP  NEEDLES) 25G X 5/8" MISC Please use with syringe for administration of Vitamin B-12 injections 10/17/21   Allwardt, Alyssa M, PA-C  omeprazole (PRILOSEC) 20 MG capsule Take 20 mg by mouth daily.    [provider]  SYRINGE-NEEDLE, DISP, 3 ML (B-D 3CC LUER-LOK SYR 25GX1") 25G X 1" 3 ML MISC USE TO INJECT VIT B12 09/26/21   Allwardt, Alyssa M, PA-C  Vitamin D, Ergocalciferol, (DRISDOL) 1.25 MG (50000 UNIT) CAPS capsule Take 1 capsule (50,000 Units total) by mouth every 7 (seven) days. 09/25/21   Allwardt, Randa Evens, PA-C      Allergies    Hydrocodone, Penicillins, Codeine, Hydrocodone-acetaminophen, and Roxicodone [oxycodone hcl]    Review of Systems   Review of Systems  Gastrointestinal:  Positive for Rebecca Schroeder.    Physical Exam Updated Vital Signs BP 110/75   Pulse 68   Temp 98.3 F (36.8 C) (Oral)   Resp 19   Ht 5\' 2"  (1.575 m)   Wt 93.4 kg   SpO2 98%   BMI 37.68 kg/m  Physical Exam Vitals and nursing note reviewed.  HENT:     Head: Atraumatic.  Cardiovascular:     Rate and Rhythm: Normal rate.  Pulmonary:     Breath sounds: Normal breath sounds.  Rebecca:     Tenderness: There is Rebecca tenderness.     Comments: Right upper quadrant tenderness without rebound or guarding.  No hernias palpated.  Skin:    Capillary Refill: Capillary refill takes less than 2 seconds.  Neurological:     Mental Status: She is alert.     ED Results / Procedures / Treatments   Labs (all labs ordered are listed, but only abnormal results are displayed) Labs Reviewed  COMPREHENSIVE METABOLIC PANEL - Abnormal; Notable for the following components:      Result Value   BUN 5 (*)    All other components within normal limits  URINALYSIS, ROUTINE W REFLEX MICROSCOPIC - Abnormal; Notable for the following components:   APPearance HAZY (*)    All other components within normal limits  CBC WITH DIFFERENTIAL/PLATELET  LIPASE, BLOOD  I-STAT BETA HCG BLOOD, ED ([facility], [facility], [facility] ONLY)     EKG None  Radiology US Abdomen Limited RUQ (LIVER/GB)  Result Date: 10/23/2021 CLINICAL DATA:  31 year old female with right upper quadrant Schroeder. Gallstones. EXAM: ULTRASOUND ABDOMEN LIMITED RIGHT UPPER QUADRANT COMPARISON:  Abdomen ultrasound 10/06/2021. FINDINGS: Gallbladder: Similar wall-echo-shadow (WES) sign suggesting a gallbladder contracted around numerous stones. Distinct shadowing echogenic stone measuring 10 mm diameter on image 7. Evidence of gallbladder wall thickening, 4-5 mm. But no sonographic Murphy sign elicited. Common bile duct: Diameter: 5 mm, normal. Liver: No focal lesion identified. Within normal limits in parenchymal echogenicity. Portal vein is patent on color Doppler imaging with normal direction of blood flow towards the liver. Other: Negative visible right kidney.  No free fluid IMPRESSION: 1. Continued appearance of abnormally thickened gallbladder contracted around multiple gallstones. Cannot exclude acute cholecystitis, but no sonographic Murphy sign was elicited. 2. No evidence of bile duct obstruction. Negative ultrasound appearance of the liver. Electronically Signed   By: Odessa Fleming M.D.   On: 10/23/2021 10:02    Procedures Procedures    Medications Ordered in ED Medications - No data to display  ED Course/ Medical Decision Making/ A&P                           Medical Decision Making Risk Decision regarding hospitalization.   Patient with right upper quadrant Schroeder.  Has had over the last year on and off.  Outpatient ultrasound that showed gallbladder with some potential wall thickening.  Plan to see surgery today.  However has had worsening Schroeder last night.  No nausea or vomiting this time.  Schroeder somewhat improved.  Ultrasound and blood work to be done. Differential diagnosis does include gallbladder disease but also other cause such as kidney stone or bowel obstruction. Ultrasound is similar to prior with some gallbladder wall thickening and stones  but no definite Murphy sign.  Schroeder is somewhat improved.  Discussed with general surgery, Dr. Magnus Ivan who will see patient.  It appears an OR has been scheduled for cholecystectomy..         Final Clinical Impression(s) / ED Diagnoses Final diagnoses:  Symptomatic cholelithiasis    Rx / DC Orders ED Discharge Orders     None         Benjiman Core, MD 10/23/21 1504

## 2021-10-23 NOTE — ED Notes (Signed)
Pt updated on plan of care, either sx this afternoon, or tomorrow per [place], [place]

## 2021-10-24 ENCOUNTER — Other Ambulatory Visit: Payer: Self-pay

## 2021-10-24 ENCOUNTER — Encounter (HOSPITAL_COMMUNITY): Payer: Self-pay

## 2021-10-24 ENCOUNTER — Observation Stay (HOSPITAL_COMMUNITY): Payer: 59 | Admitting: Anesthesiology

## 2021-10-24 ENCOUNTER — Observation Stay (HOSPITAL_BASED_OUTPATIENT_CLINIC_OR_DEPARTMENT_OTHER): Payer: 59 | Admitting: Anesthesiology

## 2021-10-24 ENCOUNTER — Encounter (HOSPITAL_COMMUNITY)
Admission: EM | Disposition: A | Discharge status: Home / Self Care | Payer: Self-pay | Source: Home / Self Care | Attending: Emergency Medicine

## 2021-10-24 DIAGNOSIS — K8000 Calculus of gallbladder with acute cholecystitis without obstruction: Secondary | ICD-10-CM

## 2021-10-24 HISTORY — PX: CHOLECYSTECTOMY: SHX55

## 2021-10-24 SURGERY — LAPAROSCOPIC CHOLECYSTECTOMY
Anesthesia: General

## 2021-10-24 MED ORDER — HYDROMORPHONE HCL 1 MG/ML IJ SOLN
0.2500 mg | INTRAMUSCULAR | Status: DC | PRN
  Administered 2021-10-24 (×4): 0.5 mg via INTRAVENOUS
  Filled 2021-10-24: qty 1

## 2021-10-24 MED ORDER — KETAMINE HCL 50 MG/5ML IJ SOSY
PREFILLED_SYRINGE | INTRAMUSCULAR | Status: AC
  Filled 2021-10-24: qty 5

## 2021-10-24 MED ORDER — KETOROLAC TROMETHAMINE 30 MG/ML IJ SOLN: 30.0000 mg | Freq: Once | INTRAMUSCULAR | Status: DC | PRN

## 2021-10-24 MED ORDER — LACTATED RINGERS IV SOLN: INTRAVENOUS | Status: DC

## 2021-10-24 MED ORDER — ACETAMINOPHEN 500 MG PO TABS: 1000.0000 mg | ORAL_TABLET | Freq: Once | ORAL | Status: DC

## 2021-10-24 MED ORDER — PROPOFOL 10 MG/ML IV BOLUS
INTRAVENOUS | Status: DC | PRN
  Administered 2021-10-24: 200 mg via INTRAVENOUS

## 2021-10-24 MED ORDER — ACETAMINOPHEN 500 MG PO TABS: 1000.0000 mg | ORAL_TABLET | Freq: Four times a day (QID) | ORAL | 0 refills | Status: DC | PRN

## 2021-10-24 MED ORDER — SODIUM CHLORIDE 0.9 % IR SOLN
Status: DC | PRN
  Administered 2021-10-24: 1000 mL

## 2021-10-24 MED ORDER — PROMETHAZINE HCL 25 MG/ML IJ SOLN
INTRAMUSCULAR | Status: AC
  Filled 2021-10-24: qty 1

## 2021-10-24 MED ORDER — LIDOCAINE 2% (20 MG/ML) 5 ML SYRINGE
INTRAMUSCULAR | Status: DC | PRN
  Administered 2021-10-24: 60 mg via INTRAVENOUS

## 2021-10-24 MED ORDER — ACETAMINOPHEN 500 MG PO TABS
ORAL_TABLET | ORAL | Status: AC
  Filled 2021-10-24: qty 2

## 2021-10-24 MED ORDER — HYDROMORPHONE HCL 1 MG/ML IJ SOLN
0.5000 mg | INTRAMUSCULAR | Status: AC | PRN
  Administered 2021-10-24 (×2): 0.5 mg via INTRAVENOUS

## 2021-10-24 MED ORDER — ONDANSETRON HCL 4 MG/2ML IJ SOLN
INTRAMUSCULAR | Status: AC
  Filled 2021-10-24: qty 2

## 2021-10-24 MED ORDER — AMISULPRIDE (ANTIEMETIC) 5 MG/2ML IV SOLN
10.0000 mg | Freq: Once | INTRAVENOUS | Status: AC | PRN
  Administered 2021-10-24: 10 mg via INTRAVENOUS

## 2021-10-24 MED ORDER — SUCCINYLCHOLINE CHLORIDE 200 MG/10ML IV SOSY
PREFILLED_SYRINGE | INTRAVENOUS | Status: DC | PRN
  Administered 2021-10-24: 140 mg via INTRAVENOUS

## 2021-10-24 MED ORDER — MIDAZOLAM HCL 2 MG/2ML IJ SOLN
INTRAMUSCULAR | Status: DC | PRN
  Administered 2021-10-24: 2 mg via INTRAVENOUS

## 2021-10-24 MED ORDER — SUGAMMADEX SODIUM 200 MG/2ML IV SOLN
INTRAVENOUS | Status: DC | PRN
  Administered 2021-10-24: 200 mg via INTRAVENOUS

## 2021-10-24 MED ORDER — HYDROMORPHONE HCL 1 MG/ML IJ SOLN
INTRAMUSCULAR | Status: AC
  Filled 2021-10-24: qty 1

## 2021-10-24 MED ORDER — FENTANYL CITRATE (PF) 250 MCG/5ML IJ SOLN
INTRAMUSCULAR | Status: AC
  Filled 2021-10-24: qty 5

## 2021-10-24 MED ORDER — FENTANYL CITRATE (PF) 250 MCG/5ML IJ SOLN
INTRAMUSCULAR | Status: DC | PRN
  Administered 2021-10-24: 100 ug via INTRAVENOUS

## 2021-10-24 MED ORDER — SCOPOLAMINE 1 MG/3DAYS TD PT72
1.0000 | MEDICATED_PATCH | TRANSDERMAL | Status: DC
  Filled 2021-10-24: qty 1

## 2021-10-24 MED ORDER — ONDANSETRON HCL 4 MG/2ML IJ SOLN
4.0000 mg | Freq: Once | INTRAMUSCULAR | Status: AC | PRN
  Administered 2021-10-24: 4 mg via INTRAVENOUS

## 2021-10-24 MED ORDER — MIDAZOLAM HCL 2 MG/2ML IJ SOLN
INTRAMUSCULAR | Status: AC
  Filled 2021-10-24: qty 2

## 2021-10-24 MED ORDER — BUPIVACAINE-EPINEPHRINE 0.25% -1:200000 IJ SOLN
INTRAMUSCULAR | Status: DC | PRN
  Administered 2021-10-24: 20 mL

## 2021-10-24 MED ORDER — LABETALOL HCL 5 MG/ML IV SOLN
INTRAVENOUS | Status: AC
  Filled 2021-10-24: qty 4

## 2021-10-24 MED ORDER — ONDANSETRON HCL 4 MG/2ML IJ SOLN
INTRAMUSCULAR | Status: DC | PRN
  Administered 2021-10-24: 4 mg via INTRAVENOUS

## 2021-10-24 MED ORDER — 0.9 % SODIUM CHLORIDE (POUR BTL) OPTIME
TOPICAL | Status: DC | PRN
  Administered 2021-10-24: 1000 mL

## 2021-10-24 MED ORDER — AMISULPRIDE (ANTIEMETIC) 5 MG/2ML IV SOLN
INTRAVENOUS | Status: AC
  Filled 2021-10-24: qty 4

## 2021-10-24 MED ORDER — SCOPOLAMINE 1 MG/3DAYS TD PT72
MEDICATED_PATCH | TRANSDERMAL | Status: AC
  Administered 2021-10-24: 1.5 mg via TRANSDERMAL
  Filled 2021-10-24: qty 1

## 2021-10-24 MED ORDER — ORAL CARE MOUTH RINSE: 15.0000 mL | Freq: Once | OROMUCOSAL | Status: AC

## 2021-10-24 MED ORDER — TRAMADOL HCL 50 MG PO TABS: 50.0000 mg | ORAL_TABLET | Freq: Four times a day (QID) | ORAL | 0 refills | Status: DC | PRN

## 2021-10-24 MED ORDER — CHLORHEXIDINE GLUCONATE 0.12 % MT SOLN
OROMUCOSAL | Status: AC
  Administered 2021-10-24: 15 mL via OROMUCOSAL
  Filled 2021-10-24: qty 15

## 2021-10-24 MED ORDER — KETAMINE HCL 10 MG/ML IJ SOLN
INTRAMUSCULAR | Status: DC | PRN
  Administered 2021-10-24: 20 mg via INTRAVENOUS

## 2021-10-24 MED ORDER — TRAMADOL HCL 50 MG PO TABS: 50.0000 mg | ORAL_TABLET | Freq: Once | ORAL | Status: DC

## 2021-10-24 MED ORDER — KETOROLAC TROMETHAMINE 30 MG/ML IJ SOLN
INTRAMUSCULAR | Status: DC | PRN
  Administered 2021-10-24: 30 mg via INTRAVENOUS

## 2021-10-24 MED ORDER — ROCURONIUM BROMIDE 10 MG/ML (PF) SYRINGE
PREFILLED_SYRINGE | INTRAVENOUS | Status: DC | PRN
  Administered 2021-10-24: 30 mg via INTRAVENOUS

## 2021-10-24 MED ORDER — PROMETHAZINE HCL 25 MG/ML IJ SOLN
6.2500 mg | Freq: Once | INTRAMUSCULAR | Status: AC
  Administered 2021-10-24: 6.25 mg via INTRAVENOUS

## 2021-10-24 MED ORDER — CHLORHEXIDINE GLUCONATE 0.12 % MT SOLN: 15.0000 mL | Freq: Once | OROMUCOSAL | Status: AC

## 2021-10-24 MED ORDER — BUPIVACAINE-EPINEPHRINE (PF) 0.25% -1:200000 IJ SOLN
INTRAMUSCULAR | Status: AC
  Filled 2021-10-24: qty 30

## 2021-10-24 MED ORDER — MEPERIDINE HCL 25 MG/ML IJ SOLN: 6.2500 mg | INTRAMUSCULAR | Status: DC | PRN

## 2021-10-24 MED ORDER — DEXAMETHASONE SODIUM PHOSPHATE 10 MG/ML IJ SOLN
INTRAMUSCULAR | Status: DC | PRN
  Administered 2021-10-24: 10 mg via INTRAVENOUS

## 2021-10-24 SURGICAL SUPPLY — 41 items
ADH SKN CLS APL DERMABOND .7 (GAUZE/BANDAGES/DRESSINGS) ×1
ADH SKN CLS LQ APL DERMABOND (GAUZE/BANDAGES/DRESSINGS) ×1
APL PRP STRL LF DISP 70% ISPRP (MISCELLANEOUS) ×1
APPLIER CLIP 5 13 M/L LIGAMAX5 (MISCELLANEOUS) ×1
APR CLP MED LRG 5 ANG JAW (MISCELLANEOUS) ×1
BAG COUNTER SPONGE SURGICOUNT (BAG) ×1 IMPLANT
BAG SPEC RTRVL LRG 6X4 10 (ENDOMECHANICALS) ×1
BAG SPNG CNTER NS LX DISP (BAG) ×1
CANISTER SUCT 3000ML PPV (MISCELLANEOUS) ×1 IMPLANT
CHLORAPREP W/TINT 26 (MISCELLANEOUS) ×1 IMPLANT
CLIP APPLIE 5 13 M/L LIGAMAX5 (MISCELLANEOUS) ×1 IMPLANT
COVER SURGICAL LIGHT HANDLE (MISCELLANEOUS) ×1 IMPLANT
DERMABOND ADVANCED .7 DNX12 (GAUZE/BANDAGES/DRESSINGS) ×1 IMPLANT
DERMABOND ADVANCED .7 DNX6 (GAUZE/BANDAGES/DRESSINGS) IMPLANT
ELECT REM PT RETURN 9FT ADLT (ELECTROSURGICAL) ×1
ELECTRODE REM PT RTRN 9FT ADLT (ELECTROSURGICAL) ×1 IMPLANT
GLOVE SURG SIGNA 7.5 PF LTX (GLOVE) ×1 IMPLANT
GOWN STRL REUS W/ TWL LRG LVL3 (GOWN DISPOSABLE) ×2 IMPLANT
GOWN STRL REUS W/ TWL XL LVL3 (GOWN DISPOSABLE) ×1 IMPLANT
GOWN STRL REUS W/TWL LRG LVL3 (GOWN DISPOSABLE) ×2
GOWN STRL REUS W/TWL XL LVL3 (GOWN DISPOSABLE) ×1
HEMOSTAT SNOW SURGICEL 2X4 (HEMOSTASIS) IMPLANT
KIT BASIN OR (CUSTOM PROCEDURE TRAY) ×1 IMPLANT
KIT TURNOVER KIT B (KITS) ×1 IMPLANT
NS IRRIG 1000ML POUR BTL (IV SOLUTION) ×1 IMPLANT
PAD ARMBOARD 7.5X6 YLW CONV (MISCELLANEOUS) ×1 IMPLANT
POUCH SPECIMEN RETRIEVAL 10MM (ENDOMECHANICALS) ×1 IMPLANT
SCISSORS LAP 5X35 DISP (ENDOMECHANICALS) ×1 IMPLANT
SET IRRIG TUBING LAPAROSCOPIC (IRRIGATION / IRRIGATOR) ×1 IMPLANT
SET TUBE SMOKE EVAC HIGH FLOW (TUBING) ×1 IMPLANT
SLEEVE ENDOPATH XCEL 5M (ENDOMECHANICALS) ×2 IMPLANT
SPECIMEN JAR SMALL (MISCELLANEOUS) ×1 IMPLANT
SUT MNCRL AB 4-0 PS2 18 (SUTURE) ×1 IMPLANT
SYS BAG RETRIEVAL 10MM (BASKET) ×1
SYSTEM BAG RETRIEVAL 10MM (BASKET) IMPLANT
TOWEL GREEN STERILE (TOWEL DISPOSABLE) ×1 IMPLANT
TOWEL GREEN STERILE FF (TOWEL DISPOSABLE) ×1 IMPLANT
TRAY LAPAROSCOPIC MC (CUSTOM PROCEDURE TRAY) ×1 IMPLANT
TROCAR XCEL BLUNT TIP 100MML (ENDOMECHANICALS) ×1 IMPLANT
TROCAR Z-THREAD OPTICAL 5X100M (TROCAR) ×1 IMPLANT
WATER STERILE IRR 1000ML POUR (IV SOLUTION) ×1 IMPLANT

## 2021-10-24 NOTE — Anesthesia Postprocedure Evaluation (Signed)
Anesthesia Post Note  Patient: Rebecca Schroeder  Procedure(s) Performed: LAPAROSCOPIC CHOLECYSTECTOMY     Patient location during evaluation: PACU Anesthesia Type: General Level of consciousness: awake and alert, oriented and patient cooperative Pain management: pain level controlled Vital Signs Assessment: post-procedure vital signs reviewed and stable Respiratory status: spontaneous breathing, nonlabored ventilation and respiratory function stable Cardiovascular status: blood pressure returned to baseline and stable Postop Assessment: no apparent nausea or vomiting Anesthetic complications: no   No notable events documented.  Last Vitals:  Vitals:   10/24/21 1419 10/24/21 1430  BP: (!) 151/93 (!) 145/74  Pulse: 96 83  Resp: 14 19  Temp: 36.7 C   SpO2: 93% 97%    Last Pain:  Vitals:   10/24/21 1214  TempSrc:   PainSc: [place]

## 2021-10-24 NOTE — Transfer of Care (Signed)
Immediate Anesthesia Transfer of Care Note  Patient: Rebecca Schroeder  Procedure(s) Performed: LAPAROSCOPIC CHOLECYSTECTOMY  Patient Location: PACU  Anesthesia Type:General  Level of Consciousness: alert , oriented and unresponsive  Airway & Oxygen Therapy: Patient Spontanous Breathing  Post-op Assessment: Report given to RN and Post -op Vital signs reviewed and stable  Post vital signs: Reviewed and stable  Last Vitals:  Vitals Value Taken Time  BP 151/93 10/24/21 1419  Temp    Pulse 73 10/24/21 1422  Resp 16 10/24/21 1422  SpO2 92 % 10/24/21 1422  Vitals shown include unvalidated device data.  Last Pain:  Vitals:   10/24/21 1214  TempSrc:   PainSc: 2       Patients Stated Pain Goal: 2 (01/77/93 9030)  Complications: No notable events documented.

## 2021-10-24 NOTE — Op Note (Signed)
Laparoscopic Cholecystectomy Procedure Note  Indications: This patient presents with symptomatic gallbladder disease and will undergo laparoscopic cholecystectomy.  Pre-operative Diagnosis: acute cholecystitis with cholelithiasis  Post-operative Diagnosis: Same  Surgeon: Abigail Miyamoto   Assistants: 0  Anesthesia: General endotracheal anesthesia  ASA Class: 1  Procedure Details  The patient was seen again in the Holding Room. The risks, benefits, complications, treatment options, and expected outcomes were discussed with the patient. The possibilities of reaction to medication, pulmonary aspiration, perforation of viscus, bleeding, recurrent infection, finding a normal gallbladder, the need for additional procedures, failure to diagnose a condition, the possible need to convert to an open procedure, and creating a complication requiring transfusion or operation were discussed with the patient. The likelihood of improving the patient's symptoms with return to their baseline status is good.  The patient and/or family concurred with the proposed plan, giving informed consent. The site of surgery properly noted. The patient was taken to Operating Room, identified as Rebecca Schroeder and the procedure verified as Laparoscopic Cholecystectomy with Intraoperative Cholangiogram. A Time Out was held and the above information confirmed.  Prior to the induction of general anesthesia, antibiotic prophylaxis was administered. General endotracheal anesthesia was then administered and tolerated well. After the induction, the abdomen was prepped with Chloraprep and draped in sterile fashion. The patient was positioned in the supine position.  Local anesthetic agent was injected into the skin near the umbilicus and an incision made. We dissected down to the abdominal fascia with blunt dissection.  The fascia was incised vertically and we entered the peritoneal cavity bluntly.  A pursestring suture of 0-Vicryl  was placed around the fascial opening.  The Hasson cannula was inserted and secured with the stay suture.  Pneumoperitoneum was then created with CO2 and tolerated well without any adverse changes in the patient's vital signs. A 5-mm port was placed in the subxiphoid position.  Two 5-mm ports were placed in the right upper quadrant. All skin incisions were infiltrated with a local anesthetic agent before making the incision and placing the trocars.   We positioned the patient in reverse Trendelenburg, tilted slightly to the patient's left.  The gallbladder was identified, the fundus grasped and retracted cephalad. Adhesions were lysed bluntly and with the electrocautery where indicated, taking care not to injure any adjacent organs or viscus. The infundibulum was grasped and retracted laterally, exposing the peritoneum overlying the triangle of Calot. This was then divided and exposed in a blunt fashion. The cystic duct was clearly identified and bluntly dissected circumferentially. A critical view of the cystic duct and cystic artery was obtained.  The cystic duct was then ligated with clips and divided. The cystic artery was, dissected free, ligated with clips and divided as well.   The gallbladder was dissected from the liver bed in retrograde fashion with the electrocautery. The gallbladder was removed and placed in an Endocatch sac. The liver bed was irrigated and inspected. Hemostasis was achieved with the electrocautery and a piece of surgical Snow.  Copious irrigation was utilized and was repeatedly aspirated until clear.  The gallbladder and Endocatch sac were then removed through the umbilical port site.  The pursestring suture was used to close the umbilical fascia.    We again inspected the right upper quadrant for hemostasis.  Pneumoperitoneum was released as we removed the trocars.  4-0 Monocryl was used to close the skin.   Skin glue was then applied. The patient was then extubated and brought  to the recovery room  in stable condition. Instrument, sponge, and needle counts were correct at closure and at the conclusion of the case.   Findings: Cholecystitis with Cholelithiasis  Estimated Blood Loss: Minimal                Specimens: Gallbladder           Complications: None; patient tolerated the procedure well.         Disposition: PACU - hemodynamically stable.         Condition: stable

## 2021-10-24 NOTE — Anesthesia Preprocedure Evaluation (Addendum)
Anesthesia Evaluation  Patient identified by MRN, date of birth, ID band Patient awake    Reviewed: Allergy & Precautions, NPO status , Patient's Chart, lab work & pertinent test results  Airway Mallampati: III  TM Distance: >3 FB Neck ROM: Full    Dental  (+) Teeth Intact, Dental Advisory Given   Pulmonary neg pulmonary ROS,    Pulmonary exam normal breath sounds clear to auscultation       Cardiovascular negative cardio ROS Normal cardiovascular exam Rhythm:Regular Rate:Normal     Neuro/Psych  Headaches, PSYCHIATRIC DISORDERS Anxiety Depression    GI/Hepatic Neg liver ROS, GERD  Medicated and Controlled,Cholecystitis    Endo/Other  Obesity BMI 37  Renal/GU negative Renal ROS  negative genitourinary   Musculoskeletal negative musculoskeletal ROS (+)   Abdominal (+) + obese,   Peds negative pediatric ROS (+)  Hematology negative hematology ROS (+) Hb 13.6   Anesthesia Other Findings   Reproductive/Obstetrics negative OB ROS                            Anesthesia Physical Anesthesia Plan  ASA: 2  Anesthesia Plan: General   Post-op Pain Management: Tylenol PO (pre-op)*, Toradol IV (intra-op)*, Dilaudid IV and Ketamine IV*   Induction: Intravenous  PONV Risk Score and Plan: 4 or greater and Ondansetron, Dexamethasone, Midazolam, Treatment may vary due to age or medical condition and Scopolamine patch - Pre-op  Airway Management Planned: Oral ETT  Additional Equipment: None  Intra-op Plan:   Post-operative Plan: Extubation in OR  Informed Consent: I have reviewed the patients History and Physical, chart, labs and discussed the procedure including the risks, benefits and alternatives for the proposed anesthesia with the patient or authorized representative who has indicated his/her understanding and acceptance.     Dental advisory given  Plan Discussed with: CRNA  Anesthesia  Plan Comments:        Anesthesia Quick Evaluation

## 2021-10-24 NOTE — Plan of Care (Signed)

## 2021-10-24 NOTE — Progress Notes (Signed)
Patient ID: Rebecca Schroeder, female   DOB: 08/22/1990, 31 y.o.   MRN: [id number]   Pre Procedure note for inpatients:   Rebecca Schroeder has been scheduled for Procedure(s): LAPAROSCOPIC CHOLECYSTECTOMY (N/A) today. The various methods of treatment have been discussed with the patient. After consideration of the risks, benefits and treatment options the patient has consented to the planned procedure.   The patient has been seen and labs reviewed. There are no changes in the patient's condition to prevent proceeding with the planned procedure today.  Recent labs:  Lab Results  Component Value Date   WBC 8.4 10/23/2021   HGB 13.6 10/23/2021   HCT 40.8 10/23/2021   PLT 243 10/23/2021   GLUCOSE 88 10/23/2021   CHOL 167 03/30/2020   TRIG 176 (H) 03/30/2020   HDL 44 (L) 03/30/2020   LDLCALC 96 03/30/2020   ALT 32 10/23/2021   AST 17 10/23/2021   NA 140 10/23/2021   K 3.6 10/23/2021   CL 104 10/23/2021   CREATININE 0.74 10/23/2021   BUN 5 (L) 10/23/2021   CO2 26 10/23/2021   TSH 2.03 09/19/2021   HGBA1C 5.4 09/19/2021    Coralie Keens, MD 10/24/2021 8:02 AM

## 2021-10-24 NOTE — Anesthesia Procedure Notes (Addendum)
Procedure Name: Intubation Date/Time: 10/24/2021 1:15 PM  Performed by: Anastasio Auerbach, CRNAPre-anesthesia Checklist: Patient identified, Emergency Drugs available, Suction available and Patient being monitored Patient Re-evaluated:Patient Re-evaluated prior to induction Oxygen Delivery Method: Circle system utilized Preoxygenation: Pre-oxygenation with 100% oxygen Induction Type: IV induction, Cricoid Pressure applied and Rapid sequence Ventilation: Mask ventilation without difficulty Laryngoscope Size: Mac and 3 Tube type: Oral Number of attempts: 1 Airway Equipment and Method: Stylet and Oral airway Placement Confirmation: ETT inserted through vocal cords under direct vision, positive ETCO2 and breath sounds checked- equal and bilateral Secured at: 21 cm Tube secured with: Tape Dental Injury: Teeth and Oropharynx as per pre-operative assessment

## 2021-10-25 ENCOUNTER — Encounter (HOSPITAL_COMMUNITY): Payer: Self-pay | Admitting: Surgery

## 2021-10-25 LAB — COMPREHENSIVE METABOLIC PANEL
ALT: 52 U/L — ABNORMAL HIGH (ref 0–44)
AST: 35 U/L (ref 15–41)
Albumin: 3.2 g/dL — ABNORMAL LOW (ref 3.5–5.0)
Alkaline Phosphatase: 55 U/L (ref 38–126)
Anion gap: 8 (ref 5–15)
BUN: <5 mg/dL — ABNORMAL LOW (ref 6–20)
CO2: 23 mmol/L (ref 22–32)
Calcium: 9 mg/dL (ref 8.9–10.3)
Chloride: 107 mmol/L (ref 98–111)
Creatinine, Ser: 0.74 mg/dL (ref 0.44–1.00)
GFR, Estimated: >60 mL/min (ref >60)
Glucose, Bld: 98 mg/dL (ref 70–99)
Potassium: 3.8 mmol/L (ref 3.5–5.1)
Sodium: 138 mmol/L (ref 135–145)
Total Bilirubin: 0.5 mg/dL (ref 0.3–1.2)
Total Protein: 5.7 g/dL — ABNORMAL LOW (ref 6.5–8.1)

## 2021-10-25 LAB — CBC
HCT: 35.9 % — ABNORMAL LOW (ref 36.0–46.0)
Hemoglobin: 12.4 g/dL (ref 12.0–15.0)
MCH: 29.8 pg (ref 26.0–34.0)
MCHC: 34.5 g/dL (ref 30.0–36.0)
MCV: 86.3 fL (ref 80.0–100.0)
Platelets: 248 10*3/uL (ref 150–400)
RBC: 4.16 MIL/uL (ref 3.87–5.11)
RDW: 12.9 % (ref 11.5–15.5)
WBC: 18.3 10*3/uL — ABNORMAL HIGH (ref 4.0–10.5)
nRBC: 0 % (ref 0.0–0.2)

## 2021-10-25 MED ORDER — TRAMADOL HCL 50 MG PO TABS: 50.0000 mg | ORAL_TABLET | Freq: Four times a day (QID) | ORAL | 0 refills | Status: DC | PRN

## 2021-10-25 MED ORDER — ONDANSETRON 4 MG PO TBDP: 4.0000 mg | ORAL_TABLET | Freq: Four times a day (QID) | ORAL | 0 refills | Status: AC | PRN

## 2021-10-25 MED ORDER — TRAMADOL HCL 50 MG PO TABS
50.0000 mg | ORAL_TABLET | Freq: Four times a day (QID) | ORAL | Status: DC | PRN
  Administered 2021-10-25 (×2): 100 mg via ORAL
  Filled 2021-10-25 (×2): qty 2

## 2021-10-25 NOTE — Progress Notes (Signed)
Mobility Specialist Progress Note:   10/25/21 1021  Mobility  Activity Ambulated independently in room  Level of Assistance Independent  Assistive Device None  Distance Ambulated (ft) 20 ft  Activity Response Tolerated well  $Mobility charge 1 Mobility   Pt received in bed and agreeable. C/o of generalized pain. Pt declining further mobility. Pt left in bed with all needs met and call bell in reach.   [employer] [employer] Mobility Specialist-Acute Rehab Secure Chat only

## 2021-10-25 NOTE — Discharge Summary (Signed)
Patient ID: Rebecca Schroeder [id number] 30-May-1990 31 y.o.  Admit date: 10/23/2021 Discharge date: 10/25/2021  Admitting Diagnosis: Cholecystitis   Discharge Diagnosis Patient Active Problem List   Diagnosis Date Noted   Biliary colic 43/56/8616   BMI 35.0-35.9,adult 03/31/2020   Migraine with aura 03/31/2019   H/O cesarean section 04/10/2017   Chronic insomnia 07/25/2016   Depression with anxiety 05/11/2015   Subcutaneous nodules 05/11/2015   Encounter for IUD insertion 09/14/2014    Consultants none  Reason for Admission: 31 y.o. female with medical history significant for history of cholelithiasis and prior cesarean sections (x2) who presented to [facility] ED with abdominal pain. She has a history of several gallstone attacks in the last year and was supposed to see Dr. Rosendo Gros in our clinic today. She had an episode of abdominal pain Friday consistent with prior gallbladder pain. Her appetite has been low since then. Pain came back in greater severity last night/early this morning. She ate small amount of mac and cheese for dinner and nothing to eat or drink since then. She has had intermittent nausea but no emesis. No fever or chills.   Work up in ED significant for [place] with thickened gallbladder and multiple gallstones.  Procedures Lap chole, Dr. Ninfa Linden 9/19  Hospital Course:  The patient was admitted and underwent a laparoscopic cholecystectomy.  The patient tolerated the procedure well.  On POD 1, the patient was tolerating a regular diet, voiding well, mobilizing, and pain was controlled with oral pain medications.  The patient was stable for DC home at this time with appropriate follow up made.   Physical Exam: Abd: soft, appropriately tender, +BS, ND, incisions c/d/i  Allergies as of 10/25/2021       Reactions   Hydrocodone Nausea And Vomiting   Penicillins Other (See Comments), Rash   Unknown Has patient had a PCN reaction causing immediate rash,  facial/tongue/throat swelling, SOB or lightheadedness with hypotension: Unknown Has patient had a PCN reaction causing severe rash involving mucus membranes or skin necrosis: Unknown Has patient had a PCN reaction that required hospitalization: Unknown Has patient had a PCN reaction occurring within the last 10 years: Unknown If all of the above answers are "NO", then may proceed with Cephalosporin use. Childhood reaction.   Codeine Nausea And Vomiting   Hydrocodone-acetaminophen Nausea And Vomiting   Roxicodone [oxycodone Hcl] Nausea And Vomiting, Other (See Comments)   SEVERE VOMITING----Patient DOES NOT tolerate with anti nausea medications        Medication List     TAKE these medications    acetaminophen 500 MG tablet Commonly known as: TYLENOL Take 2 tablets (1,000 mg total) by mouth every 6 (six) hours as needed.   B-D 3CC LUER-LOK SYR 25GX1" 25G X 1" 3 ML Misc Generic drug: SYRINGE-NEEDLE (DISP) 3 ML USE TO INJECT VIT B12   BD Disp Needles 25G X 5/8" Misc Generic drug: NEEDLE (DISP) 25 G Please use with syringe for administration of Vitamin B-12 injections   cyanocobalamin 1000 MCG/ML injection Commonly known as: VITAMIN B12 INJECT VIT B12 INTO SKIN ONCE A DAY X 1 WEEK AND THEN ONCE A WEEK X 8 WEEKS. What changed: See the new instructions.   etonogestrel-ethinyl estradiol 0.12-0.015 MG/24HR vaginal ring Commonly known as: NUVARING INSERT 1 RING VAGINALLY. LEAVE IN FOR 3 WEEKS AND REMOVE FOR 1 WEEK What changed:  how much to take how to take this when to take this additional instructions   etonogestrel-ethinyl estradiol 0.12-0.015 MG/24HR vaginal ring  Commonly known as: NUVARING INSERT 1 RING INTO THE VAGINA ONCE MONTHLY What changed: Another medication with the same name was changed. Make sure you understand how and when to take each.   omeprazole 20 MG capsule Commonly known as: PRILOSEC Take 20 mg by mouth daily.   traMADol 50 MG tablet Commonly known  as: ULTRAM Take 1-2 tablets (50-100 mg total) by mouth every 6 (six) hours as needed for moderate pain.   Vitamin D (Ergocalciferol) 1.25 MG (50000 UNIT) Caps capsule Commonly known as: DRISDOL Take 1 capsule (50,000 Units total) by mouth every 7 (seven) days.          Follow-up Information     Maczis, Carlena Hurl, PA-C Follow up on 11/14/2021.   Specialty: General Surgery Why: 11am, Arrive 30 minutes prior to your appointment time, Please bring your insurance card and photo ID Contact information: [place] [id number] [phone]                 Signed: Saverio Danker, [facility] Surgery 10/25/2021, 9:06 AM Please see Amion for pager number during day hours 7:00am-4:30pm, 7-11:30am on Weekends

## 2021-10-26 LAB — SURGICAL PATHOLOGY

## 2021-10-30 ENCOUNTER — Encounter: Payer: Self-pay | Admitting: *Deleted

## 2021-11-13 ENCOUNTER — Ambulatory Visit: Payer: 59 | Admitting: Physician Assistant

## 2022-01-18 ENCOUNTER — Encounter: Payer: Self-pay | Admitting: *Deleted

## 2022-02-16 ENCOUNTER — Other Ambulatory Visit (HOSPITAL_COMMUNITY): Payer: Self-pay

## 2022-04-18 ENCOUNTER — Telehealth (INDEPENDENT_AMBULATORY_CARE_PROVIDER_SITE_OTHER): Payer: 59 | Admitting: Physician Assistant

## 2022-04-18 DIAGNOSIS — R635 Abnormal weight gain: Secondary | ICD-10-CM

## 2022-04-18 DIAGNOSIS — E538 Deficiency of other specified B group vitamins: Secondary | ICD-10-CM

## 2022-04-18 DIAGNOSIS — E049 Nontoxic goiter, unspecified: Secondary | ICD-10-CM | POA: Diagnosis not present

## 2022-04-18 NOTE — Progress Notes (Signed)
   Virtual Visit via Video Note  I connected with  Rebecca Schroeder  on 04/18/22 at 12:30 PM EDT by a video enabled telemedicine application and verified that I am speaking with the correct person using two identifiers.  Location: Patient: home Provider: Therapist, music at [place] present: Patient and myself   I discussed the limitations of evaluation and management by telemedicine and the availability of in person appointments. The patient expressed understanding and agreed to proceed.   History of Present Illness:  32 yo female presents for virtual visit recheck today.   She is wanting to know if it is okay for her to start back on B12 injections. She was only able to start on 1 injection before she started having issues and had her gallbladder removed.  Difficult recovery after gallbladder removal. Appetite finally back to normal. Irregular bowel movements, some more loose stools as expected. Some intermittent fasting. One Dr. Malachi Bonds per week, much less than previous. Plain water makes her somewhat nauseated, starting to do flavored / bubbly sparkling waters.   192 - 197 lbs, current weight. Desires weight to be closer to 170 or 180 lb. Walking laps, coaching soccer still; still feeling tired and difficulty losing weight still.  She was found to have a goiter at her last office visit with me and did not have a chance to get an ultrasound yet.   Observations/Objective:   Gen: Awake, alert, no acute distress Resp: Breathing is even and non-labored Psych: calm/pleasant demeanor Neuro: Alert and Oriented x 3, + facial symmetry, speech is clear.   Assessment and Plan:  1. B12 deficiency Lab Results  Component Value Date   VITAMINB12 126 (L) 09/19/2021   Patient has vitamin B12 injections at home.  She will start on 1 injection once daily for 1 week, and then proceed to once weekly for a month after that.  Also recommend increasing his her diet.  2.  Weight gain, abnormal She is actively working on weight loss.  I think B12 will help.  3. Goiter As noted on last exam.  Thyroid levels were normal.  Order for ultrasound placed and patient will be contacted to schedule.   Follow Up Instructions:    I discussed the assessment and treatment plan with the patient. The patient was provided an opportunity to ask questions and all were answered. The patient agreed with the plan and demonstrated an understanding of the instructions.   The patient was advised to call back or seek an in-person evaluation if the symptoms worsen or if the condition fails to improve as anticipated.    Video connection was lost at >50% of the duration of the visit, at which time the remainder of the visit was completed via audio only.  Total time on phone: 13 minutes, 33 seconds  Ilai Hiller M Azell Bill, PA-C

## 2022-04-25 ENCOUNTER — Other Ambulatory Visit: Payer: Self-pay | Admitting: Physician Assistant

## 2022-04-25 MED ORDER — "BD DISP NEEDLES 25G X 5/8"" MISC": 0 refills | Status: DC

## 2022-05-18 ENCOUNTER — Ambulatory Visit
Admission: RE | Admit: 2022-05-18 | Discharge: 2022-05-18 | Disposition: A | Discharge status: Home / Self Care | Payer: 59 | Source: Ambulatory Visit | Attending: Physician Assistant | Admitting: Physician Assistant

## 2022-05-18 DIAGNOSIS — E049 Nontoxic goiter, unspecified: Secondary | ICD-10-CM

## 2022-05-22 ENCOUNTER — Telehealth (INDEPENDENT_AMBULATORY_CARE_PROVIDER_SITE_OTHER): Payer: 59 | Admitting: Physician Assistant

## 2022-05-22 VITALS — Ht 61.0 in | Wt 200.0 lb

## 2022-05-22 DIAGNOSIS — E559 Vitamin D deficiency, unspecified: Secondary | ICD-10-CM | POA: Diagnosis not present

## 2022-05-22 DIAGNOSIS — R9389 Abnormal findings on diagnostic imaging of other specified body structures: Secondary | ICD-10-CM | POA: Diagnosis not present

## 2022-05-22 DIAGNOSIS — E538 Deficiency of other specified B group vitamins: Secondary | ICD-10-CM

## 2022-05-22 NOTE — Progress Notes (Signed)
   Virtual Visit via Video Note  I connected with  Rebecca Schroeder  on 05/22/22 at  1:15 PM EDT by a video enabled telemedicine application and verified that I am speaking with the correct person using two identifiers.  Location: Patient: home Provider: Nature conservation officer at [employer] Persons present: Patient and myself   I discussed the limitations of evaluation and management by telemedicine and the availability of in person appointments. The patient expressed understanding and agreed to proceed.   History of Present Illness:  32 yo female patient presents for VV to discuss imaging results. Thyroid US done on 05/18/22 for goiter.  Normal thyroid levels.  No hx of kidney stones. She has a history of back pain. Feels fatigued often. Irregular bowel movements, chronic history of this.  No other concerns.    Observations/Objective:   Gen: Awake, alert, no acute distress Resp: Breathing is even and non-labored Psych: calm/pleasant demeanor Neuro: Alert and Oriented x 3, + facial symmetry, speech is clear.   Assessment and Plan:  1. Abnormal thyroid ultrasound Results as follows: IMPRESSION: 0.7 x 0.3 x 0.5 cm hypoechoic solid nodule is either located within or posterior to the inferior segment of the left thyroid lobe. As a thyroid nodule does not meet criteria for FNA or imaging follow-up. Alternatively this nodule could be a parathyroid adenoma given its posterior location. Please correlate with patient symptoms and laboratory workup for hyperparathyroidism. Nuclear medicine parathyroid scan may be obtained if clinically appropriate.  Discussed with pt in detail - no overt hyperparathyroid symptoms. Calcium levels have been normal. Will recheck labs, add PTH, consider further imaging / endocrine referral if needed.  2. B12 deficiency Currently completing B12 injections Will recheck labs   3. Vitamin D deficiency Recheck labs - completed Vit D high dose  weekly    Follow Up Instructions:    I discussed the assessment and treatment plan with the patient. The patient was provided an opportunity to ask questions and all were answered. The patient agreed with the plan and demonstrated an understanding of the instructions.   The patient was advised to call back or seek an in-person evaluation if the symptoms worsen or if the condition fails to improve as anticipated.  Tallis Soledad M Trung Wenzl, PA-C

## 2022-06-27 ENCOUNTER — Other Ambulatory Visit: Payer: 59

## 2023-06-25 ENCOUNTER — Other Ambulatory Visit: Payer: Self-pay

## 2023-06-25 ENCOUNTER — Ambulatory Visit (INDEPENDENT_AMBULATORY_CARE_PROVIDER_SITE_OTHER): Admitting: Family Medicine

## 2023-06-25 VITALS — BP 120/85 | HR 94 | Ht 61.0 in | Wt 179.8 lb

## 2023-06-25 DIAGNOSIS — Z0001 Encounter for general adult medical examination with abnormal findings: Secondary | ICD-10-CM

## 2023-06-25 DIAGNOSIS — E559 Vitamin D deficiency, unspecified: Secondary | ICD-10-CM

## 2023-06-25 DIAGNOSIS — E538 Deficiency of other specified B group vitamins: Secondary | ICD-10-CM

## 2023-06-25 DIAGNOSIS — E66811 Obesity, class 1: Secondary | ICD-10-CM

## 2023-06-25 DIAGNOSIS — E049 Nontoxic goiter, unspecified: Secondary | ICD-10-CM | POA: Insufficient documentation

## 2023-06-25 DIAGNOSIS — Z0000 Encounter for general adult medical examination without abnormal findings: Secondary | ICD-10-CM | POA: Insufficient documentation

## 2023-06-25 NOTE — Progress Notes (Signed)
 New Patient Office Visit  Subjective    Patient ID: Rebecca Schroeder, female    DOB: 03-15-1990  Age: 33 y.o. MRN: [id number]  CC:  Chief Complaint  Patient presents with   Transitions Of Care    Transfer from [facility]  Struggles w/ lack of energy     HPI Rebecca Schroeder presents to establish care. Oriented to practice routines and expectations. Has been seeing PCP regularly, sees OBGYN for [facility], last CPE 2022. PMH includes anxiety, depression, murmur, cholecystectomy, B12 deficiency, nontoxic goiter, fatigue. Has tried prozac , zoloft , lexapro , wellburin, buspar , citalopram , duloxetine  for anxiety and depression in the past as well as therapy, would not like to pursue treatment at this time.   Cervical CA screening: approximate date 04/29/23 and was normal Tobacco: non-smoker STI: declines Vaccines: UTD     06/25/2023   10:32 AM 04/28/2020    8:14 AM 03/30/2020    4:24 PM 09/12/2018   12:11 PM  GAD 7 : Generalized Anxiety Score  Nervous, Anxious, on Edge 1 1 1 3   Control/stop worrying 2 0 1 3  Worry too much - different things 1 1 0 3  Trouble relaxing 1 2 3 3   Restless 0 0 0 3  Easily annoyed or irritable 1 0 0 2  Afraid - awful might happen 1 0 0 3  Total GAD 7 Score 7 4 5 20   Anxiety Difficulty Somewhat difficult Somewhat difficult Somewhat difficult Extremely difficult       06/25/2023   10:32 AM 05/22/2022   12:53 PM 04/18/2022   11:48 AM 09/19/2021    8:10 AM 04/28/2020    8:13 AM  Depression screen PHQ 2/9  Decreased Interest 1 0 0 2 1  Down, Depressed, Hopeless 1 0 0 2 0  PHQ - 2 Score 2 0 0 4 1  Altered sleeping 3 0 0 3 2  Tired, decreased energy 3 0 0 3 2  Change in appetite 3 0 0 3 0  Feeling bad or failure about yourself  1 0 0 3 0  Trouble concentrating 3 0 0 3 3  Moving slowly or fidgety/restless 0 0 0 2 0  Suicidal thoughts 0 0 0 0 0  PHQ-9 Score 15 0 0 21 8  Difficult doing work/chores Somewhat difficult Not difficult at all  Not difficult at all Very difficult Somewhat difficult       Outpatient Encounter Medications as of 06/25/2023  Medication Sig   NUVARING 0.12-0.015 MG/24HR vaginal ring Place 1 each vaginally every 28 (twenty-eight) days.   [DISCONTINUED] acetaminophen  (TYLENOL ) 500 MG tablet Take 2 tablets (1,000 mg total) by mouth every 6 (six) hours as needed.   [DISCONTINUED] cyanocobalamin  (VITAMIN B12) 1000 MCG/ML injection INJECT VIT B12 INTO SKIN ONCE A DAY X 1 WEEK AND THEN ONCE A WEEK X 8 WEEKS.   [DISCONTINUED] NEEDLE, DISP, 25 G (BD DISP NEEDLES) 25G X 5/8" MISC Please use with syringe for administration of Vitamin B-12 injections   [DISCONTINUED] SYRINGE-NEEDLE, DISP, 3 ML (B-D 3CC LUER-LOK SYR 25GX1") 25G X 1" 3 ML MISC USE TO INJECT VIT B12   [DISCONTINUED] Vitamin D , Ergocalciferol , (DRISDOL ) 1.25 MG (50000 UNIT) CAPS capsule Take 1 capsule (50,000 Units total) by mouth every 7 (seven) days. (Patient not taking: Reported on 05/22/2022)   No facility-administered encounter medications on file as of 06/25/2023.    Past Medical History:  Diagnosis Date   Abnormality in fetal heart rate and  rhythm complicating labor and delivery 07/30/2014   Anxiety    hx PP anxiety   Back pain    occasional that radiates down leg   Non-reassuring electronic fetal monitoring tracing 07/30/2014   Preterm labor 05/03/2014   Single umbilical artery 04/15/2014   Supervision of normal pregnancy 06/18/2014    Past Surgical History:  Procedure Laterality Date   CESAREAN SECTION     CESAREAN SECTION N/A 04/10/2017   Procedure: REPEAT CESAREAN SECTION;  Surgeon: Thora Flint, MD;  Location: [facility] BIRTHING SUITES;  Service: Obstetrics;  Laterality: N/A;  Repeat edc 04/17/17 allergy to codeine, hydrocodone, PCN need RNFA....Heather  Medical laboratory scientific officer RNFA   CHOLECYSTECTOMY N/A 10/24/2021   Procedure: LAPAROSCOPIC CHOLECYSTECTOMY;  Surgeon: Oza Blumenthal, MD;  Location: [facility] OR;  Service: General;  Laterality: N/A;   TOE  SURGERY     WISDOM TOOTH EXTRACTION      Family History  Problem Relation Age of Onset   Depression Mother    Thyroid  disease Mother    Alcohol abuse Father    Hypertension Maternal Grandfather    Diabetes Maternal Grandfather    Prostate cancer Maternal Grandfather     Social History   Socioeconomic History   Marital status: Single    Spouse name: Not on file   Number of children: 2   Years of education: Not on file   Highest education level: Bachelor's degree (e.g., BA, AB, BS)  Occupational History   Not on file  Tobacco Use   Smoking status: Never   Smokeless tobacco: Never  Vaping Use   Vaping status: Never Used  Substance and Sexual Activity   Alcohol use: Yes    Alcohol/week: 0.0 standard drinks of alcohol    Comment: occasionally   Drug use: No   Sexual activity: Not Currently    Comment: Nuva Ring abstinate for over 1 yr  Other Topics Concern   Not on file  Social History Narrative   Not on file   Social Drivers of Health   Financial Resource Strain: Medium Risk (06/25/2023)   Overall Financial Resource Strain (CARDIA)    Difficulty of Paying Living Expenses: Somewhat hard  Food Insecurity: Food Insecurity Present (06/25/2023)   Hunger Vital Sign    Worried About Running Out of Food in the Last Year: Sometimes true    Ran Out of Food in the Last Year: Sometimes true  Transportation Needs: No Transportation Needs (06/25/2023)   PRAPARE - Administrator, Civil Service (Medical): No    Lack of Transportation (Non-Medical): No  Physical Activity: Insufficiently Active (06/25/2023)   Exercise Vital Sign    Days of Exercise per Week: 2 days    Minutes of Exercise per Session: 30 min  Stress: Stress Concern Present (06/25/2023)   [employer] of Occupational Health - Occupational Stress Questionnaire    Feeling of Stress : Very much  Social Connections: Moderately Integrated (06/25/2023)   Social Connection and Isolation Panel [NHANES]     Frequency of Communication with Friends and Family: More than three times a week    Frequency of Social Gatherings with Friends and Family: Once a week    Attends Religious Services: More than 4 times per year    Active Member of [employer] or Organizations: Yes    Attends Banker Meetings: More than 4 times per year    Marital Status: Never married  Intimate Partner Violence: Not At Risk (10/23/2021)   Humiliation, Afraid, Rape, and Kick questionnaire  Fear of Current or Ex-Partner: No    Emotionally Abused: No    Physically Abused: No    Sexually Abused: No    Review of Systems  Constitutional:  Positive for malaise/fatigue.  HENT: Negative.    Eyes: Negative.   Respiratory: Negative.    Cardiovascular: Negative.   Gastrointestinal: Negative.   Genitourinary: Negative.   Musculoskeletal: Negative.   Skin: Negative.   Neurological: Negative.   Endo/Heme/Allergies: Negative.   Psychiatric/Behavioral:  Positive for depression. The patient is nervous/anxious.   All other systems reviewed and are negative.       Objective    BP 120/85   Pulse 94   Ht 5\' 1"  (1.549 m)   Wt 179 lb 12.8 oz (81.6 kg)   LMP  (LMP Unknown)   SpO2 98%   BMI 33.97 kg/m   Physical Exam Vitals and nursing note reviewed.  Constitutional:      Appearance: Normal appearance. She is normal weight.  HENT:     Head: Normocephalic and atraumatic.     Right Ear: Tympanic membrane, ear canal and external ear normal.     Left Ear: Tympanic membrane, ear canal and external ear normal.     Nose: Nose normal.     Mouth/Throat:     Mouth: Mucous membranes are moist.     Pharynx: Oropharynx is clear.  Eyes:     Extraocular Movements: Extraocular movements intact.     Conjunctiva/sclera: Conjunctivae normal.     Pupils: Pupils are equal, round, and reactive to light.  Cardiovascular:     Rate and Rhythm: Normal rate and regular rhythm.     Pulses: Normal pulses.     Heart sounds: Murmur  heard.  Pulmonary:     Effort: Pulmonary effort is normal.     Breath sounds: Normal breath sounds.  Abdominal:     General: Bowel sounds are normal.     Palpations: Abdomen is soft.  Musculoskeletal:        General: Normal range of motion.     Cervical back: Normal range of motion and neck supple.  Skin:    General: Skin is warm and dry.     Capillary Refill: Capillary refill takes less than 2 seconds.  Neurological:     General: No focal deficit present.     Mental Status: She is alert and oriented to person, place, and time. Mental status is at baseline.  Psychiatric:        Mood and Affect: Mood normal.        Behavior: Behavior normal.        Thought Content: Thought content normal.        Judgment: Judgment normal.         Assessment & Plan:   Problem List Items Addressed This Visit     Obesity (BMI 30.0-34.9)   Encourage low calorie, heart healthy diet and moderate intensity exercise 150 minutes weekly. This is 3-5 times weekly for 30-50 minutes each session. Goal should be pace of 3 miles/hours, or walking 1.5 miles in 30 minutes and include strength training.      Relevant Orders   CBC with Differential/Platelet   Comprehensive metabolic panel with GFR   Lipid panel   VITAMIN D  25 Hydroxy (Vit-D Deficiency, Fractures)   Vitamin B12   Hemoglobin A1c   TSH   B12 deficiency   Chronic fatigue, not taking B12. B12 level drawn today      Relevant Orders   Vitamin  B12   Vitamin D  deficiency   Vitamin D  level today      Relevant Orders   VITAMIN D  25 Hydroxy (Vit-D Deficiency, Fractures)   Physical exam, annual - Primary   Today your medical history was reviewed and routine physical exam with labs was performed. Recommend 150 minutes of moderate intensity exercise weekly and consuming a well-balanced diet. Advised to stop smoking if a smoker, avoid smoking if a non-smoker, limit alcohol consumption to 1 drink per day for women and 2 drinks per day for men, and  avoid illicit drug use. Counseled on safe sex practices and offered STI testing today. Counseled in mental health awareness and when to seek medical care. Vaccine maintenance discussed. Appropriate health maintenance items reviewed. Return to office in 1 year for annual physical exam.       Relevant Orders   CBC with Differential/Platelet   Comprehensive metabolic panel with GFR   Lipid panel   VITAMIN D  25 Hydroxy (Vit-D Deficiency, Fractures)   Vitamin B12   Hemoglobin A1c   TSH   Nontoxic goiter   Euthyroid on exam. TSH and CMP today.    Thyroid  US  05/18/22: 0.7 x 0.3 x 0.5 cm hypoechoic solid nodule is either located within or posterior to the inferior segment of the left thyroid  lobe. As a thyroid  nodule does not meet criteria for FNA or imaging follow-up. Alternatively this nodule could be a parathyroid adenoma given its posterior location. Please correlate with patient symptoms and laboratory workup for hyperparathyroidism. Nuclear medicine parathyroid scan may be obtained if clinically appropriate.      Relevant Orders   TSH    Return in about 1 year (around 06/24/2024) for annual physical with labs 1 week prior.   Jenelle Mis, FNP

## 2023-06-25 NOTE — Assessment & Plan Note (Signed)
 Vitamin D level today

## 2023-06-25 NOTE — Assessment & Plan Note (Signed)
 Chronic fatigue, not taking B12. B12 level drawn today

## 2023-06-25 NOTE — Assessment & Plan Note (Signed)
 Today your medical history was reviewed and routine physical exam with labs was performed. Recommend 150 minutes of moderate intensity exercise weekly and consuming a well-balanced diet. Advised to stop smoking if a smoker, avoid smoking if a non-smoker, limit alcohol consumption to 1 drink per day for women and 2 drinks per day for men, and avoid illicit drug use. Counseled on safe sex practices and offered STI testing today. Counseled in mental health awareness and when to seek medical care. Vaccine maintenance discussed. Appropriate health maintenance items reviewed. Return to office in 1 year for annual physical exam.

## 2023-06-25 NOTE — Patient Instructions (Signed)
 It was great to meet you today and I'm excited to have you join the [facility] practice. I hope you had a positive experience today! If you feel so inclined, please feel free to recommend our practice to friends and family. Kurtis Bushman, FNP-C

## 2023-06-25 NOTE — Assessment & Plan Note (Signed)
 Euthyroid on exam. TSH and CMP today.    Thyroid  US  05/18/22: 0.7 x 0.3 x 0.5 cm hypoechoic solid nodule is either located within or posterior to the inferior segment of the left thyroid  lobe. As a thyroid  nodule does not meet criteria for FNA or imaging follow-up. Alternatively this nodule could be a parathyroid adenoma given its posterior location. Please correlate with patient symptoms and laboratory workup for hyperparathyroidism. Nuclear medicine parathyroid scan may be obtained if clinically appropriate.

## 2023-06-25 NOTE — Assessment & Plan Note (Signed)
Encourage low calorie, heart healthy diet and moderate intensity exercise 150 minutes weekly. This is 3-5 times weekly for 30-50 minutes each session. Goal should be pace of 3 miles/hours, or walking 1.5 miles in 30 minutes and include strength training.

## 2023-06-26 LAB — COMPREHENSIVE METABOLIC PANEL WITH GFR
AG Ratio: 1.6 (calc) (ref 1.0–2.5)
ALT: 13 U/L (ref 6–29)
AST: 11 U/L (ref 10–30)
Albumin: 4.4 g/dL (ref 3.6–5.1)
Alkaline phosphatase (APISO): 76 U/L (ref 31–125)
BUN: 14 mg/dL (ref 7–25)
CO2: 28 mmol/L (ref 20–32)
Calcium: 9.7 mg/dL (ref 8.6–10.2)
Chloride: 102 mmol/L (ref 98–110)
Creat: 0.64 mg/dL (ref 0.50–0.97)
Globulin: 2.7 g/dL (ref 1.9–3.7)
Glucose, Bld: 75 mg/dL (ref 65–99)
Potassium: 4.1 mmol/L (ref 3.5–5.3)
Sodium: 138 mmol/L (ref 135–146)
Total Bilirubin: 0.3 mg/dL (ref 0.2–1.2)
Total Protein: 7.1 g/dL (ref 6.1–8.1)
eGFR: 121 mL/min/{1.73_m2} (ref >=60)

## 2023-06-26 LAB — CBC WITH DIFFERENTIAL/PLATELET
Absolute Lymphocytes: 2640 {cells}/uL (ref 850–3900)
Absolute Monocytes: 490 {cells}/uL (ref 200–950)
Basophils Absolute: 30 {cells}/uL (ref 0–200)
Basophils Relative: 0.3 %
Eosinophils Absolute: 160 {cells}/uL (ref 15–500)
Eosinophils Relative: 1.6 %
HCT: 45.2 % — ABNORMAL HIGH (ref 35.0–45.0)
Hemoglobin: 14.6 g/dL (ref 11.7–15.5)
MCH: 28.9 pg (ref 27.0–33.0)
MCHC: 32.3 g/dL (ref 32.0–36.0)
MCV: 89.5 fL (ref 80.0–100.0)
MPV: 10.3 fL (ref 7.5–12.5)
Monocytes Relative: 4.9 %
Neutro Abs: 6680 {cells}/uL (ref 1500–7800)
Neutrophils Relative %: 66.8 %
Platelets: 304 10*3/uL (ref 140–400)
RBC: 5.05 10*6/uL (ref 3.80–5.10)
RDW: 12.6 % (ref 11.0–15.0)
Total Lymphocyte: 26.4 %
WBC: 10 10*3/uL (ref 3.8–10.8)

## 2023-06-26 LAB — LIPID PANEL
Cholesterol: 162 mg/dL (ref <200)
HDL: 59 mg/dL (ref >=50)
LDL Cholesterol (Calc): 85 mg/dL
Non-HDL Cholesterol (Calc): 103 mg/dL (ref <130)
Total CHOL/HDL Ratio: 2.7 (calc) (ref <5.0)
Triglycerides: 86 mg/dL (ref <150)

## 2023-06-26 LAB — VITAMIN D 25 HYDROXY (VIT D DEFICIENCY, FRACTURES): Vit D, 25-Hydroxy: 29 ng/mL — ABNORMAL LOW (ref 30–100)

## 2023-06-26 LAB — HEMOGLOBIN A1C
Hgb A1c MFr Bld: 5.2 % (ref <5.7)
Mean Plasma Glucose: 103 mg/dL
eAG (mmol/L): 5.7 mmol/L

## 2023-06-26 LAB — TSH: TSH: 1.52 m[IU]/L

## 2023-06-26 LAB — VITAMIN B12: Vitamin B-12: 396 pg/mL (ref 200–1100)

## 2023-06-27 ENCOUNTER — Ambulatory Visit: Payer: Self-pay | Admitting: Family Medicine

## 2023-10-12 ENCOUNTER — Encounter: Payer: Self-pay | Admitting: Family Medicine

## 2023-10-15 ENCOUNTER — Other Ambulatory Visit: Payer: Self-pay | Admitting: Family Medicine

## 2023-10-15 DIAGNOSIS — R9389 Abnormal findings on diagnostic imaging of other specified body structures: Secondary | ICD-10-CM

## 2023-10-15 DIAGNOSIS — E049 Nontoxic goiter, unspecified: Secondary | ICD-10-CM

## 2023-10-21 ENCOUNTER — Ambulatory Visit
Admission: RE | Admit: 2023-10-21 | Discharge: 2023-10-21 | Disposition: A | Discharge status: Home / Self Care | Source: Ambulatory Visit | Attending: Family Medicine | Admitting: Family Medicine

## 2023-10-21 DIAGNOSIS — R9389 Abnormal findings on diagnostic imaging of other specified body structures: Secondary | ICD-10-CM

## 2023-10-21 DIAGNOSIS — E049 Nontoxic goiter, unspecified: Secondary | ICD-10-CM

## 2023-10-29 ENCOUNTER — Ambulatory Visit: Payer: Self-pay | Admitting: Family Medicine

## 2023-11-07 ENCOUNTER — Encounter: Payer: Self-pay | Admitting: Family Medicine

## 2023-11-07 ENCOUNTER — Ambulatory Visit (INDEPENDENT_AMBULATORY_CARE_PROVIDER_SITE_OTHER): Admitting: Family Medicine

## 2023-11-07 VITALS — BP 118/81 | HR 92 | Temp 97.9°F | Ht 61.0 in | Wt 182.8 lb

## 2023-11-07 DIAGNOSIS — F419 Anxiety disorder, unspecified: Secondary | ICD-10-CM

## 2023-11-07 DIAGNOSIS — R5383 Other fatigue: Secondary | ICD-10-CM | POA: Diagnosis not present

## 2023-11-07 DIAGNOSIS — Z23 Encounter for immunization: Secondary | ICD-10-CM

## 2023-11-07 DIAGNOSIS — E042 Nontoxic multinodular goiter: Secondary | ICD-10-CM | POA: Diagnosis not present

## 2023-11-07 DIAGNOSIS — J029 Acute pharyngitis, unspecified: Secondary | ICD-10-CM

## 2023-11-07 DIAGNOSIS — F32A Depression, unspecified: Secondary | ICD-10-CM

## 2023-11-07 NOTE — Assessment & Plan Note (Addendum)
 Strep negative. No otitis media or externa. Will start ibuprofen  and tylenol  PRN and follow up if symptoms persist or worsen. Referring to endo for thyroid  nodules. Normal physical exam.

## 2023-11-07 NOTE — Progress Notes (Addendum)
 Subjective:  HPI: Rebecca Schroeder is a 33 y.o. female presenting on 11/07/2023 for Acute Visit (Ear pain shooting pain from ear to throat x 1 week )   HPI Patient is in today for 1 week ear pain that is radiating from her right neck to her ear. Associated with sneezing. Denies fever, chills, body aches, rhinorrhea, cough, congestion, ear drainage,  Her boys had strep Has tried nothing.   Review of Systems  All other systems reviewed and are negative.   Relevant past medical history reviewed and updated as indicated.   Past Medical History:  Diagnosis Date   Abnormality in fetal heart rate and rhythm complicating labor and delivery 07/30/2014   Allergy    Anxiety    hx PP anxiety   Back pain    occasional that radiates down leg   Depression    GERD (gastroesophageal reflux disease)    Non-reassuring electronic fetal monitoring tracing 07/30/2014   Preterm labor 05/03/2014   Single umbilical artery 04/15/2014   Supervision of normal pregnancy 06/18/2014     Past Surgical History:  Procedure Laterality Date   CESAREAN SECTION     CESAREAN SECTION N/A 04/10/2017   Procedure: REPEAT CESAREAN SECTION;  Surgeon: Curlene Agent, MD;  Location: [facility] BIRTHING SUITES;  Service: Obstetrics;  Laterality: N/A;  Repeat edc 04/17/17 allergy to codeine, hydrocodone, PCN need RNFA....Heather  Medical laboratory scientific officer RNFA   CHOLECYSTECTOMY N/A 10/24/2021   Procedure: LAPAROSCOPIC CHOLECYSTECTOMY;  Surgeon: Vernetta Berg, MD;  Location: [facility] OR;  Service: General;  Laterality: N/A;   TOE SURGERY     WISDOM TOOTH EXTRACTION      Allergies and medications reviewed and updated.   Current Outpatient Medications:    NUVARING 0.12-0.015 MG/24HR vaginal ring, Place 1 each vaginally every 28 (twenty-eight) days., Disp: , Rfl:   Allergies  Allergen Reactions   Hydrocodone Nausea And Vomiting   Penicillins Other (See Comments), Rash and Dermatitis    Unknown  Has patient had a PCN reaction causing  immediate rash, facial/tongue/throat swelling, SOB or lightheadedness with hypotension: Unknown  Has patient had a PCN reaction causing severe rash involving mucus membranes or skin necrosis: Unknown  Has patient had a PCN reaction that required hospitalization: Unknown  Has patient had a PCN reaction occurring within the last 10 years: Unknown  If all of the above answers are NO, then may proceed with Cephalosporin use.  Childhood reaction.   Codeine Nausea And Vomiting   Hydrocodone-Acetaminophen  Nausea And Vomiting   Hydrocodone-Acetaminophen  Nausea And Vomiting   Roxicodone [Oxycodone Hcl] Nausea And Vomiting and Other (See Comments)    SEVERE VOMITING----Patient DOES NOT tolerate with anti nausea medications    Objective:   BP 118/81   Pulse 92   Temp 97.9 F (36.6 C)   Ht 5' 1 (1.549 m)   Wt 182 lb 12.8 oz (82.9 kg)   LMP 11/03/2023 (Approximate)   SpO2 98%   BMI 34.54 kg/m      11/07/2023    4:09 PM 06/25/2023   10:25 AM 05/22/2022   12:50 PM  Vitals with BMI  Height 5' 1 5' 1 5' 1  Weight 182 lbs 13 oz 179 lbs 13 oz 200 lbs  BMI 34.56 33.99 37.81  Systolic 118 120   Diastolic 81 85   Pulse 92 94      Physical Exam Vitals and nursing note reviewed.  Constitutional:      Appearance: Normal appearance. She is normal weight.  HENT:  Head: Normocephalic and atraumatic.     Right Ear: Tympanic membrane, ear canal and external ear normal.     Left Ear: Tympanic membrane, ear canal and external ear normal.     Nose: Nose normal.     Mouth/Throat:     Mouth: Mucous membranes are moist.     Pharynx: Oropharynx is clear.  Eyes:     Extraocular Movements: Extraocular movements intact.     Conjunctiva/sclera: Conjunctivae normal.     Pupils: Pupils are equal, round, and reactive to light.  Cardiovascular:     Rate and Rhythm: Normal rate and regular rhythm.     Pulses: Normal pulses.     Heart sounds: Normal heart sounds.  Pulmonary:     Effort:  Pulmonary effort is normal.     Breath sounds: Normal breath sounds.  Musculoskeletal:     Cervical back: No tenderness.  Lymphadenopathy:     Cervical: No cervical adenopathy.  Skin:    General: Skin is warm and dry.  Neurological:     General: No focal deficit present.     Mental Status: She is alert and oriented to person, place, and time. Mental status is at baseline.  Psychiatric:        Mood and Affect: Mood normal.        Behavior: Behavior normal.        Thought Content: Thought content normal.        Judgment: Judgment normal.     Assessment & Plan:  Sore throat Assessment & Plan: Strep negative. No otitis media or externa. Will start ibuprofen  and tylenol  PRN and follow up if symptoms persist or worsen. Referring to endo for thyroid  nodules. Normal physical exam.  Orders: -     STREP GROUP A AG, W/REFLEX TO CULT -     Culture, Group A Strep  Other fatigue Assessment & Plan: Multiple thyroid  nodules, declines PTH today. Referral to Endo.   Anxiety and depression Assessment & Plan: She believes her symptoms are related to fatigue and sleep interference. Declines medication or therapist. Encourage to call [phone] or seek immediate care at [facility] for SI/HI. Referral to Endo for thyroid  symptoms and nodules   Multiple thyroid  nodules -     Ambulatory referral to Endocrinology     Follow up plan: Return if symptoms worsen or fail to improve.  Jeoffrey GORMAN Barrio, FNP

## 2023-11-07 NOTE — Assessment & Plan Note (Signed)
 She believes her symptoms are related to fatigue and sleep interference. Declines medication or therapist. Encourage to call [phone] or seek immediate care at [facility] for SI/HI. Referral to Endo for thyroid  symptoms and nodules

## 2023-11-07 NOTE — Assessment & Plan Note (Signed)
 Multiple thyroid  nodules, declines PTH today. Referral to Endo.

## 2023-11-09 LAB — CULTURE, GROUP A STREP
Micro Number: 17047785
SPECIMEN QUALITY:: ADEQUATE

## 2023-11-09 LAB — STREP GROUP A AG, W/REFLEX TO CULT: Streptococcus Group A AG: NOT DETECTED

## 2023-12-26 ENCOUNTER — Encounter: Payer: Self-pay | Admitting: Internal Medicine

## 2023-12-26 ENCOUNTER — Ambulatory Visit (INDEPENDENT_AMBULATORY_CARE_PROVIDER_SITE_OTHER): Admitting: Internal Medicine

## 2023-12-26 ENCOUNTER — Other Ambulatory Visit

## 2023-12-26 VITALS — BP 130/80 | HR 83 | Ht 61.0 in | Wt 183.6 lb

## 2023-12-26 DIAGNOSIS — E042 Nontoxic multinodular goiter: Secondary | ICD-10-CM

## 2023-12-26 NOTE — Patient Instructions (Addendum)
 Please stop at the lab.  We will schedule a new appointment if the labs are abnormal.

## 2023-12-26 NOTE — Progress Notes (Addendum)
 Patient ID: Rebecca Schroeder, female   DOB: October 06, 1990, 33 y.o.   MRN: [id number]  HPI  Rebecca Schroeder is a 33 y.o.-year-old female, referred by her PCP, [name], FNP, for evaluation for thyroid  nodules.  She was diagnosed with thyroid  nodules in 2024, after seeing her PCP, who noticed an enlarged thyroid .  She was sent for an ultrasound at that time.  In the following year, she mentions that people started commenting on her neck being enlarged.  She also describes increased fatigue, and, despite the fact that she lost 20 pounds from 2024 to 2025, her weight has plateaued and she finds it difficult to lose weight now.    She mentions that after her gallbladder surgery in 2023, she has problems tolerating food, particularly chewing it and relies on energy drinks, and occasionally protein shakes.  She has problems with constipation but occasionally has to go to the restroom frequently throughout the day.  She was previously found to have a vitamin D  deficiency and she was started on injections, but she is not taking these now.  She has B12 and her energy drink.  She also has a slightly low vitamin D  level and she is not taking a supplement.  She is now working from home - loves it.  She has several dogs and cats.  Thyroid  U/S (05/18/2022): Parenchymal Echotexture: Mildly heterogenous  Isthmus: 0.3 cm  Right lobe: 5.2 x 1.2 x 1.7 cm  Left lobe: 4.6 x 1.2 x 1.6 cm  _________________________________________________________   Estimated total number of nodules >/= 1 cm: 0 _________________________________________________________   0.7 x 0.3 x 0.5 cm hypoechoic solid nodule seen within or adjacent to the posterior aspect of the inferior left thyroid  lobe.   Remaining subcentimeter thyroid  nodules do not meet criteria for FNA or imaging follow-up.   IMPRESSION: 0.7 x 0.3 x 0.5 cm hypoechoic solid nodule is either located within or posterior to the inferior segment of the left thyroid   lobe. As a thyroid  nodule does not meet criteria for FNA or imaging follow-up. Alternatively this nodule could be a parathyroid  adenoma given its posterior location. Please correlate with patient symptoms and laboratory workup for hyperparathyroidism. Nuclear medicine parathyroid  scan may be obtained if clinically appropriate.   Thyroid  U/S (10/21/2023): Parenchymal Echotexture: Normal  Isthmus: 0.5 cm ,previously 0.3 cm  Right lobe: 4.9 x 1.8 x 1.5 cm ,previously 5.2 x 1.2 x 1.7 cm  Left lobe: 4.1 x 1.2 x 1.5 cm ,previously 4.6 x 1.2 x 1.6 cm  ________________________________________________________   Estimated total number of nodules >/= 1 cm: 0 _________________________________________________________   Similar benign appearing solid nodule in the right superior thyroid  (labeled 1, 0.6 cm, previously 0.5 cm).   Similar benign appearing right mid solid thyroid  nodule (labeled 2, 0.7 cm, previously 0.5 cm).   Similar benign-appearing left inferior posterior solid thyroid  nodule (labeled 3, 0.9 cm, previously 0.7 cm). Again, this nodule may represent a parathyroid  adenoma.     No cervical lymphadenopathy.   IMPRESSION: Similar benign appearing multinodular thyroid . None of the visualized thyroid  nodules meet criteria for dedicated follow-up.  Pt denies: - feeling nodules in neck - hoarseness - dysphagia - choking But she does have sore throat, especially in the morning.  I reviewed pt's thyroid  tests - normal: Lab Results  Component Value Date   TSH 1.52 06/25/2023   TSH 2.03 09/19/2021   TSH 1.01 03/30/2020   TSH 0.94 08/13/2017   TSH 0.82 07/25/2016   TSH  1.00 04/21/2015    We will TPO antibodies were not elevated:  Component     Latest Ref Rng 09/19/2021  Thyroperoxidase Ab SerPl-aCnc     <9 IU/mL <1    Also, calcium levels are normal: Lab Results  Component Value Date   CALCIUM 9.7 06/25/2023   CALCIUM 9.0 10/25/2021   CALCIUM 9.4 10/23/2021   CALCIUM  9.1 09/19/2021   CALCIUM 9.6 03/30/2020   CALCIUM 9.1 04/23/2019   CALCIUM 9.0 09/12/2018   CALCIUM 9.0 08/13/2017   CALCIUM 10.0 06/19/2017   CALCIUM 7.9 (L) 04/10/2017   CALCIUM 9.3 07/25/2016   CALCIUM 9.6 05/10/2015   CALCIUM 9.2 04/21/2015   Vitamin D  level was very slightly low: Lab Results  Component Value Date   VD25OH 29 (L) 06/25/2023   + FH of thyroid  ds. In mother. No FH of thyroid  cancer. No h/o radiation tx to head or neck.  No seaweed or kelp. No recent contrast studies. No steroid use. No herbal supplements. No Biotin supplements other than in her energy drink. No Hair, Skin and Nails vitamins.  Pt also has a history of migraines, insomnia, anxiety/depression, vitamin D  and B12 deficiency, obesity. She had cholecystectomy 2023.   ROS: Constitutional: + See HPI  Eyes: no blurry vision, no xerophthalmia ENT: + sore throat,  + see HPI Cardiovascular: no CP/SOB/palpitations/leg swelling Respiratory: no cough/SOB Gastrointestinal: no N/V/+ D/+ C/+ acid reflux Musculoskeletal: no muscle/joint aches Skin: no rashes Neurological: no tremors/numbness/tingling/dizziness Psychiatric: + depression/anxiety  Past Medical History:  Diagnosis Date   Abnormality in fetal heart rate and rhythm complicating labor and delivery 07/30/2014   Allergy    Anxiety    hx PP anxiety   Back pain    occasional that radiates down leg   Depression    GERD (gastroesophageal reflux disease)    Non-reassuring electronic fetal monitoring tracing 07/30/2014   Preterm labor 05/03/2014   Single umbilical artery 04/15/2014   Supervision of normal pregnancy 06/18/2014   Past Surgical History:  Procedure Laterality Date   CESAREAN SECTION     CESAREAN SECTION N/A 04/10/2017   Procedure: REPEAT CESAREAN SECTION;  Surgeon: Curlene Agent, MD;  Location: [facility];  Service: Obstetrics;  Laterality: N/A;  Repeat edc 04/17/17 allergy to codeine, hydrocodone, PCN need RNFA....Heather   Medical Laboratory Scientific Officer RNFA   CHOLECYSTECTOMY N/A 10/24/2021   Procedure: LAPAROSCOPIC CHOLECYSTECTOMY;  Surgeon: Vernetta Berg, MD;  Location: [facility] OR;  Service: General;  Laterality: N/A;   TOE SURGERY     WISDOM TOOTH EXTRACTION     Social History   Socioeconomic History   Marital status: Single    Spouse name: Not on file   Number of children: 2   Years of education: Not on file   Highest education level: Bachelor's degree (e.g., BA, AB, BS)  Occupational History   Not on file  Tobacco Use   Smoking status: Never   Smokeless tobacco: Never  Vaping Use   Vaping status: Never Used  Substance and Sexual Activity   Alcohol use: Yes    Alcohol/week: 0.0 standard drinks of alcohol    Comment: occasionally   Drug use: No   Sexual activity: Not Currently    Comment: Nuva Ring abstinate for 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  Other Topics Concern   Not on file  Social History Narrative   Not on file   Social Drivers of Health   Financial Resource Strain: Medium Risk (06/25/2023)   Overall Financial Resource Strain (CARDIA)    Difficulty of  Paying Living Expenses: Somewhat hard  Food Insecurity: Food Insecurity Present (06/25/2023)   Hunger Vital Sign    Worried About Running Out of Food in the Last Year: Sometimes true    Ran Out of Food in the Last Year: Sometimes true  Transportation Needs: No Transportation Needs (06/25/2023)   PRAPARE - Administrator, Civil Service (Medical): No    Lack of Transportation (Non-Medical): No  Physical Activity: Insufficiently Active (06/25/2023)   Exercise Vital Sign    Days of Exercise per Week: 2 days    Minutes of Exercise per Session: 30 min  Stress: Stress Concern Present (06/25/2023)   [employer] of Occupational Health - Occupational Stress Questionnaire    Feeling of Stress : Very much  Social Connections: Moderately Integrated (06/25/2023)   Social Connection and Isolation Panel    Frequency of Communication with Friends and Family: More than  three times a week    Frequency of Social Gatherings with Friends and Family: Once a week    Attends Religious Services: More than 4 times per year    Active Member of [employer] or Organizations: Yes    Attends Banker Meetings: More than 4 times per year    Marital Status: Never married  Intimate Partner Violence: Not At Risk (10/23/2021)   Humiliation, Afraid, Rape, and Kick questionnaire    Fear of Current or Ex-Partner: No    Emotionally Abused: No    Physically Abused: No    Sexually Abused: No   Current Outpatient Medications on File Prior to Visit  Medication Sig Dispense Refill   NUVARING 0.12-0.015 MG/24HR vaginal ring Place 1 each vaginally every 28 (twenty-eight) days.     No current facility-administered medications on file prior to visit.   Allergies  Allergen Reactions   Hydrocodone Nausea And Vomiting   Penicillins Other (See Comments), Rash and Dermatitis    Unknown  Has patient had a PCN reaction causing immediate rash, facial/tongue/throat swelling, SOB or lightheadedness with hypotension: Unknown  Has patient had a PCN reaction causing severe rash involving mucus membranes or skin necrosis: Unknown  Has patient had a PCN reaction that required hospitalization: Unknown  Has patient had a PCN reaction occurring within the last 10 years: Unknown  If all of the above answers are NO, then may proceed with Cephalosporin use.  Childhood reaction.   Codeine Nausea And Vomiting   Hydrocodone-Acetaminophen  Nausea And Vomiting   Hydrocodone-Acetaminophen  Nausea And Vomiting   Roxicodone [Oxycodone Hcl] Nausea And Vomiting and Other (See Comments)    SEVERE VOMITING----Patient DOES NOT tolerate with anti nausea medications   Family History  Problem Relation Age of Onset   Depression Mother    Thyroid  disease Mother    Alcohol abuse Father    Hypertension Maternal Grandfather    Diabetes Maternal Grandfather    Prostate cancer Maternal Grandfather     PE: BP 130/80   Pulse 83   Ht 5' 1 (1.549 m)   Wt 183 lb 9.6 oz (83.3 kg)   SpO2 99%   BMI 34.69 kg/m  Wt Readings from Last 20 Encounters:  12/26/23 183 lb 9.6 oz (83.3 kg)  11/07/23 182 lb 12.8 oz (82.9 kg)  06/25/23 179 lb 12.8 oz (81.6 kg)  05/22/22 200 lb (90.7 kg)  10/24/21 200 lb (90.7 kg)  09/19/21 206 lb 9.6 oz (93.7 kg)  04/28/20 194 lb (88 kg)  03/30/20 194 lb 3.2 oz (88.1 kg)  04/23/19 180 lb (81.6 kg)  12/24/18 183 lb (83 kg)  09/12/18 183 lb (83 kg)  04/23/18 178 lb (80.7 kg)  01/27/18 181 lb (82.1 kg)  12/25/17 187 lb (84.8 kg)  11/28/17 185 lb (83.9 kg)  08/13/17 180 lb (81.6 kg)  06/19/17 179 lb (81.2 kg)  03/26/17 199 lb (90.3 kg)  04/05/17 199 lb (90.3 kg)  09/03/16 183 lb (83 kg)   Constitutional: overweight, in NAD Eyes:  EOMI, no exophthalmos ENT: no neck masses, no thyromegaly, no cervical lymphadenopathy Cardiovascular: RRR, No MRG Respiratory: CTA B Musculoskeletal: no deformities Skin:no rashes Neurological: no tremor with outstretched hands  ASSESSMENT: 1. Thyroid  nodules  2.  Fatigue  PLAN: 1. Thyroid  nodules - I reviewed the images of her thyroid  ultrasound along with the patient. I pointed out that the dominant nodules are small and without concerning features. - We discussed that other risk factors for cancer may be: - hypoechogenicity (or nodules are hypoechoic but mostly due to the fact that they appear to be mostly cystic) - presence of microcalcifications - presence of internal blood flow - taller-than-wide distribution - irregular contours Which her nodules do not have. Pt does not have a thyroid  cancer family history or a personal history of RxTx to head/neck. All these would favor benignity.  - For one of the nodules, due to the posterior position, a parathyroid  adenoma could not be ruled out.  However, she has had normal calcium levels throughout her life.  We discussed that we can recheck the parathyroid  hormone  today, and if normal, no further investigation is needed for this. - she does not have any neck compression symptoms or masses felt on palpation of her neck today.  Also, the thyroid  does not appear to be enlarged and did not appear heterogeneous to suggest inflammation.  Of note, her TPO antibodies we discussed that her thyroid  size appears to be normal on the ultrasound so she does not actually have a goiter (thyroid  enlargement), but it is likely that this was more visible in the last year after she lost 20 pounds - Her thyroid  function tests were normal, we will not repeat them today - If her parathyroid  hormone is elevated, we definitely have to have her back and see if she needs parathyroid  surgery. - we did discuss that she does not need to have further imaging for her thyroid  nodules  2.  Fatigue - At today's visit, she also mentions fatigue.  We discussed that this does not appear to be related to thyroid , which is functioning well based on her TFTs over the years.  She has a history of B12 deficiency and she is getting some B12 vitamin in her energy drink.  She also has mild vitamin D  insufficiency and we discussed that she may benefit from a multivitamin which will give her all of the vitamins and electrolytes.  She was not anemic on the last CBC available for review and her HbA1c level was normal.  Upon questioning, she is not having a good diet.  She does not like chewing her food and gets tired of different foods easily.  She feels that this started around the time of her gallbladder surgery.  She mostly relies on liquid meals.  We discussed that this may contribute to the fact that she is not able to lose more weight.  We discussed about possibly trying a probiotic (given example) but I also recommended a referral to nutrition.  She will discuss with PCP about this.  Component  Latest Ref Rng 12/26/2023  PTH, Intact     16 - 77 pg/mL 24   Calcium Ionized     4.7 - 5.5 mg/dL 5.0    Normal.  Lela Fendt, MD PhD [facility]

## 2023-12-27 ENCOUNTER — Ambulatory Visit: Payer: Self-pay | Admitting: Internal Medicine

## 2023-12-30 LAB — PARATHYROID HORMONE, INTACT (NO CA): PTH: 24 pg/mL (ref 16–77)

## 2023-12-30 LAB — CALCIUM, IONIZED: Calcium, Ion: 5 mg/dL (ref 4.7–5.5)

## 2024-03-11 ENCOUNTER — Telehealth: Admitting: Family Medicine

## 2024-03-11 VITALS — Ht 61.0 in | Wt 183.0 lb

## 2024-03-11 DIAGNOSIS — F411 Generalized anxiety disorder: Secondary | ICD-10-CM

## 2024-03-11 MED ORDER — FLUOXETINE HCL 10 MG PO TABS: 10.0000 mg | ORAL_TABLET | Freq: Every day | ORAL | 3 refills | Status: AC

## 2024-03-11 NOTE — Progress Notes (Signed)
 "                    MyChart Video Visit    Virtual Visit via Video Note   This format is felt to be most appropriate for this patient at this time. Physical exam was limited by quality of the video and audio technology used for the visit.    Patient location: home Provider location: [place] [facility] Persons involved in the visit: patient, provider  I discussed the limitations of evaluation and management by telemedicine and the availability of in person appointments. The patient expressed understanding and agreed to proceed.  Patient: Rebecca Schroeder   DOB: 1990/07/29   34 y.o. Female  MRN: [id number] Visit Date: 03/11/2024  Today's healthcare provider: Jeoffrey GORMAN Barrio, FNP   Chief Complaint  Patient presents with   Medical Management of Chronic Issues    Discuss starting mental health medication.     Subjective:    HPI  Discussed the use of AI scribe software for clinical note transcription with the patient, who gave verbal consent to proceed.  History of Present Illness Rebecca Schroeder is a 34 year old female who presents with symptoms of anxiety and overstimulation.  She reports significant overstimulation and irritability, feeling angry and yelling more often, especially outside of work. She feels unable to manage stress effectively and reports exhaustion and lack of energy. Tasks such as doing laundry or dishes become overwhelming, and she becomes frustrated and angry. Although her children have not noticed changes in her behavior, she feels she yells too much and becomes easily frustrated with them.  Her mind is constantly active with multiple simultaneous thoughts, some of which are not based in reality. She reports excessive worrying and overthinking, and describes difficulty assisting her grandmother with simple tasks like using a remote control due to feeling overwhelmed and irritated.  She is on a weight loss journey but struggles with  eating patterns. She often skips meals during the day, eats dinner, and then consumes sweets excessively at night. This cycle repeats daily.  She has a history of trying various medications for her symptoms, including Zoloft , Wellbutrin , Lexapro , and Prozac , but did not notice enough change to continue them. She has also experienced panic attacks in the past but not recently. She has not been on any medications for a long time and expresses a need to try something new, as she feels her symptoms are getting worse.  No current panic attacks, homicidal or suicidal thoughts. She has not found therapy helpful in the past and feels she has a good support system among friends. She reports difficulty sleeping and excessive worrying.     03/11/2024    3:04 PM 11/07/2023    4:14 PM 06/25/2023   10:32 AM 05/22/2022   12:53 PM 04/18/2022   11:48 AM  Depression screen PHQ 2/9  Decreased Interest 3 3 1  0 0  Down, Depressed, Hopeless 0 2 1 0 0  PHQ - 2 Score 3 5 2  0 0  Altered sleeping 3 3 3  0 0  Tired, decreased energy 3 3 3  0 0  Change in appetite 3 3 3  0 0  Feeling bad or failure about yourself  0 2 1 0 0  Trouble concentrating 3 3 3  0 0  Moving slowly or fidgety/restless 0 2 0 0 0  Suicidal thoughts 0 0 0 0 0  PHQ-9 Score 15 21  15   0  0  Difficult doing work/chores Very difficult Very difficult Somewhat difficult Not difficult at all Not difficult at all     Data saved with a previous flowsheet row definition      03/11/2024    3:06 PM 11/07/2023    4:15 PM 06/25/2023   10:32 AM 04/28/2020    8:14 AM  GAD 7 : Generalized Anxiety Score  Nervous, Anxious, on Edge 3 3  1  1    Control/stop worrying 3 3  2   0   Worry too much - different things 3 3  1  1    Trouble relaxing 3 3  1  2    Restless 0 1  0  0   Easily annoyed or irritable 3 3  1   0   Afraid - awful might happen 3 3  1   0   Total GAD 7 Score 18 19 7 4   Anxiety Difficulty Extremely difficult Very difficult Somewhat difficult Somewhat  difficult     Data saved with a previous flowsheet row definition      Review of Systems  All other systems reviewed and are negative.        Objective:    Ht 5' 1 (1.549 m)   Wt 183 lb (83 kg) Comment: pt. reported  LMP  (Approximate)   BMI 34.58 kg/m       Physical Exam Constitutional:      General: She is not in acute distress.    Appearance: Normal appearance. She is not toxic-appearing.  Eyes:     Conjunctiva/sclera: Conjunctivae normal.  Pulmonary:     Effort: Pulmonary effort is normal. No respiratory distress.  Skin:    Coloration: Skin is not pale.  Neurological:     General: No focal deficit present.     Mental Status: She is alert and oriented to person, place, and time.  Psychiatric:        Mood and Affect: Mood normal.        Behavior: Behavior normal.        Thought Content: Thought content normal.        Judgment: Judgment normal.         Assessment & Plan:    Problem List Items Addressed This Visit   None Visit Diagnoses       Generalized anxiety disorder    -  Primary   Relevant Medications   FLUoxetine  (PROZAC ) 10 MG tablet   Other Relevant Orders   Ambulatory referral to Psychiatry       Assessment and Plan Assessment & Plan Generalized anxiety disorder Symptoms include overstimulation, irritability, excessive worrying, and stress management difficulties. Previous medications ineffective. No panic attacks, homicidal, or suicidal thoughts. Discussed gene insight testing for medication guidance. Referral to psychiatry recommended. - Started low dose Prozac , monitor efficacy over 4-6 weeks. - Referred to psychiatry for further evaluation and management. - Follow-up in 4 weeks to assess medication response and adjust dose.    Meds ordered this encounter  Medications   FLUoxetine  (PROZAC ) 10 MG tablet    Sig: Take 1 tablet (10 mg total) by mouth daily.    Dispense:  90 tablet    Refill:  3    Supervising Provider:    DUANNE LOWERS T [[id number]]     Return in about 4 weeks (around 04/08/2024) for anxiety/depression.     I discussed the assessment and treatment plan with the patient. The patient was provided an opportunity to ask questions and all were answered. The patient  agreed with the plan and demonstrated an understanding of the instructions.   The patient was advised to call back or seek an in-person evaluation if the symptoms worsen or if the condition fails to improve as anticipated.  I provided 15 minutes of non-face-to-face time during this encounter.  Jeoffrey GORMAN Barrio, FNP [place]    "

## 2024-03-12 ENCOUNTER — Other Ambulatory Visit (HOSPITAL_COMMUNITY): Payer: Self-pay

## 2024-03-12 ENCOUNTER — Telehealth: Payer: Self-pay | Admitting: Pharmacy Technician

## 2024-03-12 NOTE — Telephone Encounter (Signed)
 Pharmacy Patient Advocate Encounter   Received notification from [facility] KEY that prior authorization for Fluoxetine  10 mg tablets is required/requested.   Insurance verification completed.   The patient is insured through [employer].   Per test claim:  Fluoxetine  10 mg CAPSULES is preferred by the insurance.  If suggested medication is appropriate, Please send in a new RX and discontinue this one. If not, please advise as to why it's not appropriate so that we may request a Prior Authorization. Please note, some preferred medications may still require a PA.  If the suggested medications have not been trialed and there are no contraindications to their use, the PA will not be submitted, as it will not be approved. Archived Key: [id number]

## 2024-03-25 ENCOUNTER — Ambulatory Visit (HOSPITAL_COMMUNITY): Admitting: Registered Nurse

## 2024-03-31 ENCOUNTER — Ambulatory Visit: Admitting: Family Medicine

## 2024-04-20 ENCOUNTER — Ambulatory Visit: Admitting: Family Medicine

## 2024-06-18 ENCOUNTER — Other Ambulatory Visit

## 2024-06-25 ENCOUNTER — Encounter: Admitting: Family Medicine
